# Patient Record
Sex: Female | Born: 1965
Health system: Southern US, Community
[De-identification: ages and names within clinical notes are randomized; demographics above are authoritative.]

## PROBLEM LIST (undated history)

## (undated) DIAGNOSIS — D150 Benign neoplasm of thymus: Secondary | ICD-10-CM

## (undated) DIAGNOSIS — D649 Anemia, unspecified: Secondary | ICD-10-CM

## (undated) DIAGNOSIS — K219 Gastro-esophageal reflux disease without esophagitis: Secondary | ICD-10-CM

## (undated) DIAGNOSIS — R112 Nausea with vomiting, unspecified: Secondary | ICD-10-CM

## (undated) DIAGNOSIS — Z9889 Other specified postprocedural states: Secondary | ICD-10-CM

## (undated) DIAGNOSIS — Z9884 Bariatric surgery status: Secondary | ICD-10-CM

## (undated) DIAGNOSIS — I10 Essential (primary) hypertension: Secondary | ICD-10-CM

## (undated) HISTORY — PX: NECK SURGERY: SHX720

## (undated) HISTORY — DX: Bariatric surgery status: Z98.84

## (undated) HISTORY — PX: ANKLE SURGERY: SHX546

## (undated) HISTORY — PX: BREAST SURGERY: SHX581

## (undated) HISTORY — DX: Benign neoplasm of thymus: D15.0

---

## 1997-07-28 ENCOUNTER — Ambulatory Visit (HOSPITAL_COMMUNITY): Admission: RE | Admit: 1997-07-28 | Discharge: 1997-07-28 | Payer: Self-pay | Admitting: Internal Medicine

## 1997-11-28 ENCOUNTER — Emergency Department (HOSPITAL_COMMUNITY): Admission: EM | Admit: 1997-11-28 | Discharge: 1997-11-28 | Payer: Self-pay | Admitting: Emergency Medicine

## 1998-04-14 ENCOUNTER — Other Ambulatory Visit: Admission: RE | Admit: 1998-04-14 | Discharge: 1998-04-14 | Payer: Self-pay | Admitting: Obstetrics and Gynecology

## 1999-07-08 ENCOUNTER — Encounter: Admission: RE | Admit: 1999-07-08 | Discharge: 1999-07-08 | Payer: Self-pay | Admitting: Neurosurgery

## 1999-07-08 ENCOUNTER — Encounter: Payer: Self-pay | Admitting: Neurosurgery

## 1999-07-25 ENCOUNTER — Encounter
Admission: RE | Admit: 1999-07-25 | Discharge: 1999-08-22 | Payer: Self-pay | Admitting: Physical Medicine & Rehabilitation

## 1999-10-19 ENCOUNTER — Emergency Department (HOSPITAL_COMMUNITY): Admission: EM | Admit: 1999-10-19 | Discharge: 1999-10-19 | Payer: Self-pay | Admitting: Emergency Medicine

## 1999-10-19 ENCOUNTER — Encounter: Payer: Self-pay | Admitting: Emergency Medicine

## 2000-01-27 ENCOUNTER — Other Ambulatory Visit: Admission: RE | Admit: 2000-01-27 | Discharge: 2000-01-27 | Payer: Self-pay | Admitting: Obstetrics and Gynecology

## 2000-04-20 ENCOUNTER — Emergency Department (HOSPITAL_COMMUNITY): Admission: EM | Admit: 2000-04-20 | Discharge: 2000-04-20 | Payer: Self-pay | Admitting: Emergency Medicine

## 2000-04-27 ENCOUNTER — Encounter: Payer: Self-pay | Admitting: Obstetrics and Gynecology

## 2000-04-27 ENCOUNTER — Encounter (INDEPENDENT_AMBULATORY_CARE_PROVIDER_SITE_OTHER): Payer: Self-pay

## 2000-04-27 ENCOUNTER — Inpatient Hospital Stay (HOSPITAL_COMMUNITY): Admission: AD | Admit: 2000-04-27 | Discharge: 2000-05-11 | Payer: Self-pay | Admitting: Obstetrics and Gynecology

## 2000-04-30 ENCOUNTER — Encounter: Payer: Self-pay | Admitting: *Deleted

## 2000-05-02 ENCOUNTER — Encounter: Payer: Self-pay | Admitting: *Deleted

## 2000-05-03 ENCOUNTER — Encounter: Payer: Self-pay | Admitting: Obstetrics and Gynecology

## 2000-05-12 ENCOUNTER — Encounter: Admission: RE | Admit: 2000-05-12 | Discharge: 2000-07-11 | Payer: Self-pay | Admitting: Obstetrics and Gynecology

## 2000-06-07 ENCOUNTER — Other Ambulatory Visit: Admission: RE | Admit: 2000-06-07 | Discharge: 2000-06-07 | Payer: Self-pay | Admitting: Obstetrics and Gynecology

## 2000-07-12 ENCOUNTER — Encounter: Admission: RE | Admit: 2000-07-12 | Discharge: 2000-08-11 | Payer: Self-pay | Admitting: Obstetrics and Gynecology

## 2000-09-11 ENCOUNTER — Encounter: Admission: RE | Admit: 2000-09-11 | Discharge: 2000-10-11 | Payer: Self-pay | Admitting: Obstetrics and Gynecology

## 2000-10-12 ENCOUNTER — Encounter: Admission: RE | Admit: 2000-10-12 | Discharge: 2000-11-11 | Payer: Self-pay | Admitting: Obstetrics and Gynecology

## 2000-11-14 ENCOUNTER — Ambulatory Visit (HOSPITAL_COMMUNITY): Admission: RE | Admit: 2000-11-14 | Discharge: 2000-11-14 | Payer: Self-pay | Admitting: Internal Medicine

## 2000-12-24 ENCOUNTER — Encounter: Payer: Self-pay | Admitting: Internal Medicine

## 2000-12-24 ENCOUNTER — Encounter: Admission: RE | Admit: 2000-12-24 | Discharge: 2000-12-24 | Payer: Self-pay | Admitting: Internal Medicine

## 2001-01-23 HISTORY — PX: GASTRIC BYPASS: SHX52

## 2001-05-28 ENCOUNTER — Encounter (INDEPENDENT_AMBULATORY_CARE_PROVIDER_SITE_OTHER): Payer: Self-pay | Admitting: Specialist

## 2001-05-28 ENCOUNTER — Ambulatory Visit (HOSPITAL_COMMUNITY): Admission: RE | Admit: 2001-05-28 | Discharge: 2001-05-28 | Payer: Self-pay | Admitting: Obstetrics and Gynecology

## 2001-11-13 ENCOUNTER — Other Ambulatory Visit: Admission: RE | Admit: 2001-11-13 | Discharge: 2001-11-13 | Payer: Self-pay | Admitting: Obstetrics and Gynecology

## 2003-04-01 ENCOUNTER — Other Ambulatory Visit: Admission: RE | Admit: 2003-04-01 | Discharge: 2003-04-01 | Payer: Self-pay | Admitting: Obstetrics and Gynecology

## 2003-07-13 ENCOUNTER — Ambulatory Visit (HOSPITAL_COMMUNITY): Admission: RE | Admit: 2003-07-13 | Discharge: 2003-07-13 | Payer: Self-pay | Admitting: Specialist

## 2003-07-13 ENCOUNTER — Ambulatory Visit (HOSPITAL_BASED_OUTPATIENT_CLINIC_OR_DEPARTMENT_OTHER): Admission: RE | Admit: 2003-07-13 | Discharge: 2003-07-13 | Payer: Self-pay | Admitting: Specialist

## 2003-07-13 ENCOUNTER — Encounter (INDEPENDENT_AMBULATORY_CARE_PROVIDER_SITE_OTHER): Payer: Self-pay | Admitting: *Deleted

## 2004-04-18 ENCOUNTER — Encounter (INDEPENDENT_AMBULATORY_CARE_PROVIDER_SITE_OTHER): Payer: Self-pay | Admitting: Specialist

## 2004-04-18 ENCOUNTER — Ambulatory Visit (HOSPITAL_BASED_OUTPATIENT_CLINIC_OR_DEPARTMENT_OTHER): Admission: RE | Admit: 2004-04-18 | Discharge: 2004-04-18 | Payer: Self-pay | Admitting: Specialist

## 2004-04-18 ENCOUNTER — Ambulatory Visit (HOSPITAL_COMMUNITY): Admission: RE | Admit: 2004-04-18 | Discharge: 2004-04-18 | Payer: Self-pay | Admitting: Specialist

## 2004-07-05 ENCOUNTER — Other Ambulatory Visit: Admission: RE | Admit: 2004-07-05 | Discharge: 2004-07-05 | Payer: Self-pay | Admitting: Obstetrics and Gynecology

## 2004-08-30 ENCOUNTER — Encounter: Admission: RE | Admit: 2004-08-30 | Discharge: 2004-08-30 | Payer: Self-pay | Admitting: Internal Medicine

## 2004-09-15 ENCOUNTER — Ambulatory Visit (HOSPITAL_COMMUNITY): Payer: Self-pay | Admitting: Psychiatry

## 2004-10-13 ENCOUNTER — Ambulatory Visit (HOSPITAL_COMMUNITY): Payer: Self-pay | Admitting: Psychiatry

## 2004-10-26 ENCOUNTER — Encounter: Admission: RE | Admit: 2004-10-26 | Discharge: 2004-10-26 | Payer: Self-pay | Admitting: Neurosurgery

## 2004-11-22 ENCOUNTER — Encounter: Admission: RE | Admit: 2004-11-22 | Discharge: 2004-11-22 | Payer: Self-pay | Admitting: Family Medicine

## 2004-12-19 ENCOUNTER — Encounter: Admission: RE | Admit: 2004-12-19 | Discharge: 2004-12-19 | Payer: Self-pay | Admitting: Family Medicine

## 2005-07-10 ENCOUNTER — Ambulatory Visit: Payer: Self-pay | Admitting: Physical Medicine & Rehabilitation

## 2005-07-10 ENCOUNTER — Encounter
Admission: RE | Admit: 2005-07-10 | Discharge: 2005-10-08 | Payer: Self-pay | Admitting: Physical Medicine & Rehabilitation

## 2005-07-28 ENCOUNTER — Ambulatory Visit: Payer: Self-pay | Admitting: Anesthesiology

## 2005-07-28 ENCOUNTER — Encounter: Admission: RE | Admit: 2005-07-28 | Discharge: 2005-10-26 | Payer: Self-pay | Admitting: Anesthesiology

## 2005-09-14 ENCOUNTER — Encounter: Admission: RE | Admit: 2005-09-14 | Discharge: 2005-09-14 | Payer: Self-pay | Admitting: Neurosurgery

## 2005-09-26 ENCOUNTER — Ambulatory Visit: Payer: Self-pay | Admitting: Anesthesiology

## 2006-01-23 DIAGNOSIS — D649 Anemia, unspecified: Secondary | ICD-10-CM

## 2006-01-23 HISTORY — DX: Anemia, unspecified: D64.9

## 2006-03-14 ENCOUNTER — Ambulatory Visit (HOSPITAL_COMMUNITY): Admission: RE | Admit: 2006-03-14 | Discharge: 2006-03-14 | Payer: Self-pay | Admitting: Obstetrics and Gynecology

## 2006-05-23 ENCOUNTER — Ambulatory Visit: Payer: Self-pay | Admitting: Oncology

## 2006-05-23 ENCOUNTER — Encounter (HOSPITAL_COMMUNITY): Admission: RE | Admit: 2006-05-23 | Discharge: 2006-08-21 | Payer: Self-pay | Admitting: *Deleted

## 2006-07-13 ENCOUNTER — Ambulatory Visit: Payer: Self-pay | Admitting: Oncology

## 2006-07-18 LAB — CBC & DIFF AND RETIC
Basophils Absolute: 0 10*3/uL (ref 0.0–0.1)
Eosinophils Absolute: 0.1 10*3/uL (ref 0.0–0.5)
HGB: 9.4 g/dL — ABNORMAL LOW (ref 11.6–15.9)
IRF: 0.3 (ref 0.130–0.330)
MONO#: 0.5 10*3/uL (ref 0.1–0.9)
NEUT#: 5.4 10*3/uL (ref 1.5–6.5)
RBC: 4.33 10*6/uL (ref 3.70–5.32)
RDW: 20.4 % — ABNORMAL HIGH (ref 11.3–14.5)
RETIC #: 26 10*3/uL (ref 19.7–115.1)
Retic %: 0.6 % (ref 0.4–2.3)
WBC: 7.9 10*3/uL (ref 3.9–10.0)

## 2006-07-18 LAB — COMPREHENSIVE METABOLIC PANEL
ALT: 19 U/L (ref 0–35)
AST: 25 U/L (ref 0–37)
Albumin: 3.6 g/dL (ref 3.5–5.2)
Alkaline Phosphatase: 105 U/L (ref 39–117)
BUN: 10 mg/dL (ref 6–23)
Chloride: 108 mEq/L (ref 96–112)
Potassium: 4.1 mEq/L (ref 3.5–5.3)
Sodium: 139 mEq/L (ref 135–145)
Total Protein: 6.1 g/dL (ref 6.0–8.3)

## 2006-07-18 LAB — MORPHOLOGY: PLT EST: ADEQUATE

## 2006-07-18 LAB — IRON AND TIBC: UIBC: 284 ug/dL

## 2006-07-18 LAB — FERRITIN: Ferritin: 6 ng/mL — ABNORMAL LOW (ref 10–291)

## 2006-08-30 ENCOUNTER — Ambulatory Visit: Payer: Self-pay | Admitting: Oncology

## 2006-09-03 LAB — CBC WITH DIFFERENTIAL/PLATELET
Basophils Absolute: 0 10*3/uL (ref 0.0–0.1)
EOS%: 2.5 % (ref 0.0–7.0)
Eosinophils Absolute: 0.1 10*3/uL (ref 0.0–0.5)
HGB: 8.8 g/dL — ABNORMAL LOW (ref 11.6–15.9)
LYMPH%: 29 % (ref 14.0–48.0)
MCH: 22.6 pg — ABNORMAL LOW (ref 26.0–34.0)
MCV: 69.4 fL — ABNORMAL LOW (ref 81.0–101.0)
MONO%: 8.3 % (ref 0.0–13.0)
NEUT#: 2.8 10*3/uL (ref 1.5–6.5)
Platelets: 229 10*3/uL (ref 145–400)
RBC: 3.9 10*6/uL (ref 3.70–5.32)

## 2006-09-03 LAB — FERRITIN: Ferritin: 7 ng/mL — ABNORMAL LOW (ref 10–291)

## 2006-09-16 ENCOUNTER — Emergency Department (HOSPITAL_COMMUNITY): Admission: EM | Admit: 2006-09-16 | Discharge: 2006-09-16 | Payer: Self-pay | Admitting: Emergency Medicine

## 2006-10-05 LAB — CBC WITH DIFFERENTIAL/PLATELET
Basophils Absolute: 0 10*3/uL (ref 0.0–0.1)
Eosinophils Absolute: 0.1 10*3/uL (ref 0.0–0.5)
HCT: 33.3 % — ABNORMAL LOW (ref 34.8–46.6)
HGB: 11.2 g/dL — ABNORMAL LOW (ref 11.6–15.9)
MONO#: 0.4 10*3/uL (ref 0.1–0.9)
NEUT%: 63.3 % (ref 39.6–76.8)
Platelets: 245 10*3/uL (ref 145–400)
WBC: 5.3 10*3/uL (ref 3.9–10.0)
lymph#: 1.5 10*3/uL (ref 0.9–3.3)

## 2006-10-17 ENCOUNTER — Ambulatory Visit (HOSPITAL_COMMUNITY): Admission: RE | Admit: 2006-10-17 | Discharge: 2006-10-18 | Payer: Self-pay | Admitting: Orthopedic Surgery

## 2006-11-07 ENCOUNTER — Ambulatory Visit: Payer: Self-pay | Admitting: Oncology

## 2006-11-30 LAB — CBC WITH DIFFERENTIAL/PLATELET
BASO%: 0.6 % (ref 0.0–2.0)
EOS%: 2.4 % (ref 0.0–7.0)
HCT: 34.7 % — ABNORMAL LOW (ref 34.8–46.6)
MCH: 28 pg (ref 26.0–34.0)
MCHC: 34.8 g/dL (ref 32.0–36.0)
MONO%: 5.7 % (ref 0.0–13.0)
NEUT%: 64.7 % (ref 39.6–76.8)
RDW: 19.7 % — ABNORMAL HIGH (ref 11.3–14.5)
lymph#: 1.3 10*3/uL (ref 0.9–3.3)

## 2006-11-30 LAB — IRON AND TIBC: UIBC: 170 ug/dL

## 2007-02-27 ENCOUNTER — Ambulatory Visit: Payer: Self-pay | Admitting: Oncology

## 2007-03-08 LAB — CBC & DIFF AND RETIC
Basophils Absolute: 0 10*3/uL (ref 0.0–0.1)
Eosinophils Absolute: 0.2 10*3/uL (ref 0.0–0.5)
HCT: 40.5 % (ref 34.8–46.6)
HGB: 12.9 g/dL (ref 11.6–15.9)
IRF: 0.18 (ref 0.130–0.330)
MCH: 27.4 pg (ref 26.0–34.0)
MONO#: 0.5 10*3/uL (ref 0.1–0.9)
NEUT#: 5 10*3/uL (ref 1.5–6.5)
NEUT%: 69 % (ref 39.6–76.8)
RDW: 13.3 % (ref 11.3–14.5)
RETIC #: 31.2 10*3/uL (ref 19.7–115.1)
WBC: 7.3 10*3/uL (ref 3.9–10.0)
lymph#: 1.5 10*3/uL (ref 0.9–3.3)

## 2007-03-08 LAB — COMPREHENSIVE METABOLIC PANEL
ALT: 51 U/L — ABNORMAL HIGH (ref 0–35)
AST: 56 U/L — ABNORMAL HIGH (ref 0–37)
CO2: 25 mEq/L (ref 19–32)
Calcium: 8.2 mg/dL — ABNORMAL LOW (ref 8.4–10.5)
Chloride: 108 mEq/L (ref 96–112)
Sodium: 142 mEq/L (ref 135–145)
Total Protein: 6 g/dL (ref 6.0–8.3)

## 2007-03-08 LAB — IRON AND TIBC
%SAT: 23 % (ref 20–55)
Iron: 56 ug/dL (ref 42–145)

## 2007-03-08 LAB — FERRITIN: Ferritin: 149 ng/mL (ref 10–291)

## 2007-05-29 ENCOUNTER — Ambulatory Visit: Payer: Self-pay | Admitting: Oncology

## 2007-08-27 ENCOUNTER — Ambulatory Visit: Payer: Self-pay | Admitting: Oncology

## 2007-09-18 LAB — CBC WITH DIFFERENTIAL/PLATELET
Eosinophils Absolute: 0.1 10*3/uL (ref 0.0–0.5)
HCT: 33.6 % — ABNORMAL LOW (ref 34.8–46.6)
LYMPH%: 24.2 % (ref 14.0–48.0)
MCV: 86.3 fL (ref 81.0–101.0)
MONO%: 7.1 % (ref 0.0–13.0)
NEUT#: 2.9 10*3/uL (ref 1.5–6.5)
NEUT%: 67.1 % (ref 39.6–76.8)
Platelets: 172 10*3/uL (ref 145–400)
RBC: 3.9 10*6/uL (ref 3.70–5.32)

## 2007-09-18 LAB — IRON AND TIBC: Iron: 62 ug/dL (ref 42–145)

## 2007-09-18 LAB — FERRITIN: Ferritin: 151 ng/mL (ref 10–291)

## 2007-10-17 ENCOUNTER — Encounter: Admission: RE | Admit: 2007-10-17 | Discharge: 2007-10-17 | Payer: Self-pay | Admitting: Family Medicine

## 2007-10-17 ENCOUNTER — Emergency Department (HOSPITAL_COMMUNITY): Admission: EM | Admit: 2007-10-17 | Discharge: 2007-10-17 | Payer: Self-pay | Admitting: Emergency Medicine

## 2007-10-25 ENCOUNTER — Encounter: Payer: Self-pay | Admitting: Family Medicine

## 2007-10-25 ENCOUNTER — Ambulatory Visit: Payer: Self-pay | Admitting: Vascular Surgery

## 2007-10-25 ENCOUNTER — Ambulatory Visit (HOSPITAL_COMMUNITY): Admission: RE | Admit: 2007-10-25 | Discharge: 2007-10-25 | Payer: Self-pay | Admitting: Family Medicine

## 2007-11-07 ENCOUNTER — Ambulatory Visit: Payer: Self-pay | Admitting: Thoracic Surgery (Cardiothoracic Vascular Surgery)

## 2007-11-13 ENCOUNTER — Ambulatory Visit (HOSPITAL_COMMUNITY)
Admission: RE | Admit: 2007-11-13 | Discharge: 2007-11-13 | Payer: Self-pay | Admitting: Thoracic Surgery (Cardiothoracic Vascular Surgery)

## 2007-11-13 ENCOUNTER — Ambulatory Visit: Payer: Self-pay | Admitting: Thoracic Surgery (Cardiothoracic Vascular Surgery)

## 2007-11-25 ENCOUNTER — Encounter: Payer: Self-pay | Admitting: Thoracic Surgery (Cardiothoracic Vascular Surgery)

## 2007-11-25 ENCOUNTER — Ambulatory Visit: Payer: Self-pay | Admitting: Thoracic Surgery (Cardiothoracic Vascular Surgery)

## 2007-11-25 ENCOUNTER — Inpatient Hospital Stay (HOSPITAL_COMMUNITY)
Admission: RE | Admit: 2007-11-25 | Discharge: 2007-11-28 | Payer: Self-pay | Admitting: Thoracic Surgery (Cardiothoracic Vascular Surgery)

## 2007-11-27 ENCOUNTER — Ambulatory Visit: Payer: Self-pay | Admitting: Oncology

## 2007-12-03 ENCOUNTER — Ambulatory Visit: Payer: Self-pay | Admitting: Thoracic Surgery (Cardiothoracic Vascular Surgery)

## 2007-12-13 ENCOUNTER — Encounter
Admission: RE | Admit: 2007-12-13 | Discharge: 2007-12-13 | Payer: Self-pay | Admitting: Thoracic Surgery (Cardiothoracic Vascular Surgery)

## 2007-12-13 ENCOUNTER — Ambulatory Visit: Payer: Self-pay | Admitting: Thoracic Surgery (Cardiothoracic Vascular Surgery)

## 2008-01-29 ENCOUNTER — Ambulatory Visit: Payer: Self-pay | Admitting: Oncology

## 2008-01-31 LAB — CBC WITH DIFFERENTIAL/PLATELET
BASO%: 0.5 % (ref 0.0–2.0)
EOS%: 4 % (ref 0.0–7.0)
LYMPH%: 36.1 % (ref 14.0–48.0)
MCHC: 33.5 g/dL (ref 32.0–36.0)
MONO#: 0.3 10*3/uL (ref 0.1–0.9)
RBC: 4.26 10*6/uL (ref 3.70–5.32)
WBC: 5.1 10*3/uL (ref 3.9–10.0)
lymph#: 1.8 10*3/uL (ref 0.9–3.3)

## 2008-01-31 LAB — FERRITIN: Ferritin: 42 ng/mL (ref 10–291)

## 2008-01-31 LAB — IRON AND TIBC
TIBC: 282 ug/dL (ref 250–470)
UIBC: 239 ug/dL

## 2008-05-27 ENCOUNTER — Ambulatory Visit: Payer: Self-pay | Admitting: Oncology

## 2008-05-29 LAB — CBC WITH DIFFERENTIAL/PLATELET
BASO%: 0.3 % (ref 0.0–2.0)
EOS%: 3.1 % (ref 0.0–7.0)
MCH: 28.7 pg (ref 25.1–34.0)
MCHC: 33.8 g/dL (ref 31.5–36.0)
RBC: 4.11 10*6/uL (ref 3.70–5.45)
RDW: 14.1 % (ref 11.2–14.5)
lymph#: 1.8 10*3/uL (ref 0.9–3.3)

## 2008-05-29 LAB — VITAMIN B12: Vitamin B-12: 256 pg/mL (ref 211–911)

## 2008-09-13 IMAGING — RF DG HYSTEROGRAM
5 series · 5 of 5 positions shown · non-contrast
Comparison: none

CLINICAL DATA: Status post ESSURE implants for contraception.  Evaluate tubal patency.
HYSTEROSALPINGOGRAM:

[Series 1: run · 1 of 1 slices shown (1 of 5)]
[im 1/1]
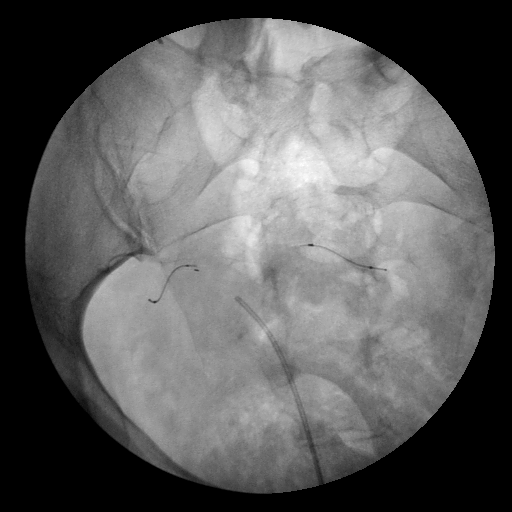

[Series 2: run · 1 of 1 slices shown (2 of 5)]
[im 1/1]
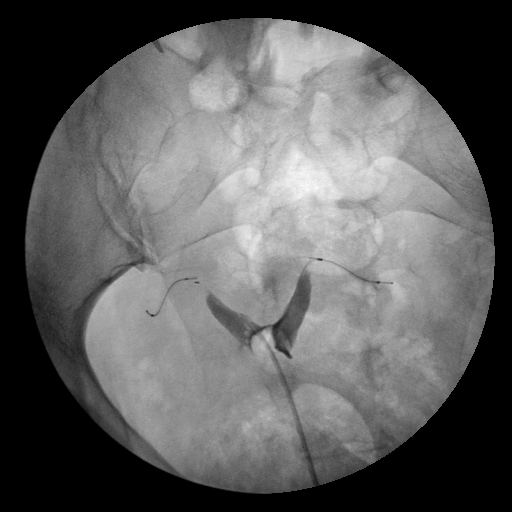

[Series 3: run · 1 of 1 slices shown (3 of 5)]
[im 1/1]
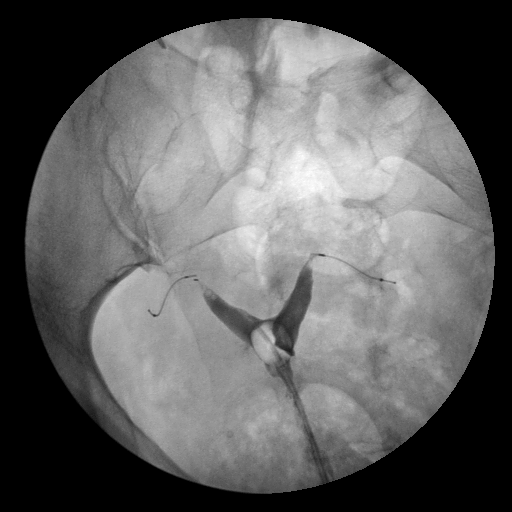

[Series 4: run · 1 of 1 slices shown (4 of 5)]
[im 1/1]
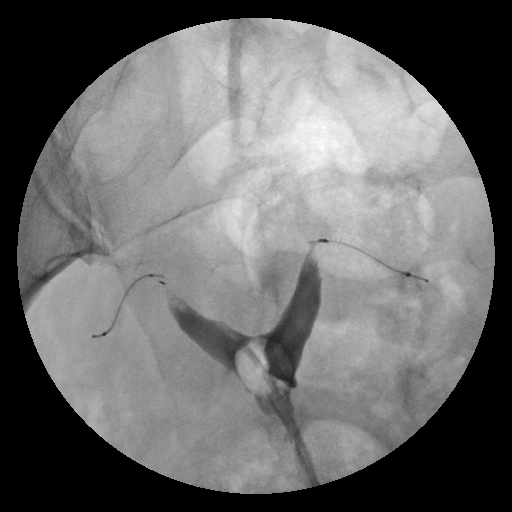

[Series 5: run · 1 of 1 slices shown (5 of 5)]
[im 1/1]
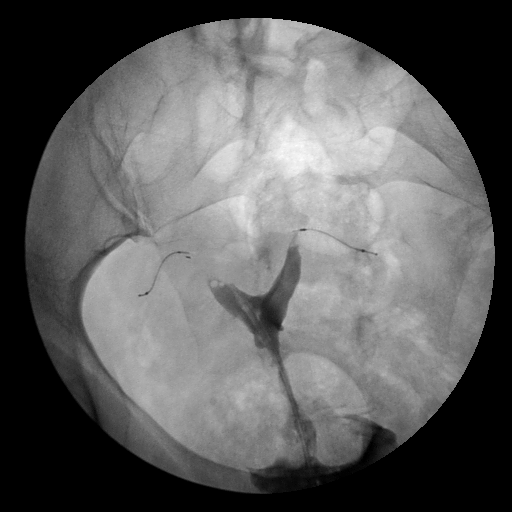

[5 of 5 positions shown; findings below may reference images not displayed]

FINDINGS: Hysterosalpingogram was performed by Dr. Dothi.  Five fluoroscopic spot images are submitted for interpretation.
Both Essure implants are seen in satisfactory position within the fallopian tubes.  The right fallopian tube is occluded at the cornua.  Minimal contrast is seen in the proximal most portion of the left fallopian tube along the proximal ends of the inner and outer coils.  However, there is no contrast in the tube along the length of the outer coil.  
A deep indentation is seen in the fundal portion of the endometrial cavity dividing the majority of the endometrial cavity into two portions, without significant splaying of the intercornual distance.  This is most consistent with a subseptate uterus.
IMPRESSION: 1. Satisfactory location of both Essure implants. 
2. Bilateral tubal occlusion, grade 1 on the right and grade 2 on the left.  
3. Probable subseptate uterus.

## 2008-09-30 ENCOUNTER — Ambulatory Visit: Payer: Self-pay | Admitting: Oncology

## 2008-10-12 LAB — CBC WITH DIFFERENTIAL/PLATELET
Basophils Absolute: 0 10*3/uL (ref 0.0–0.1)
Eosinophils Absolute: 0.1 10*3/uL (ref 0.0–0.5)
HGB: 10.7 g/dL — ABNORMAL LOW (ref 11.6–15.9)
MCV: 84.9 fL (ref 79.5–101.0)
MONO%: 6.8 % (ref 0.0–14.0)
NEUT#: 2.1 10*3/uL (ref 1.5–6.5)
RDW: 13.4 % (ref 11.2–14.5)

## 2008-10-12 LAB — FERRITIN: Ferritin: 18 ng/mL (ref 10–291)

## 2008-10-12 LAB — IRON AND TIBC
%SAT: 26 % (ref 20–55)
Iron: 71 ug/dL (ref 42–145)
UIBC: 202 ug/dL

## 2008-10-30 ENCOUNTER — Ambulatory Visit: Payer: Self-pay | Admitting: Oncology

## 2008-12-15 ENCOUNTER — Ambulatory Visit: Payer: Self-pay | Admitting: Thoracic Surgery (Cardiothoracic Vascular Surgery)

## 2008-12-15 ENCOUNTER — Encounter
Admission: RE | Admit: 2008-12-15 | Discharge: 2008-12-15 | Payer: Self-pay | Admitting: Thoracic Surgery (Cardiothoracic Vascular Surgery)

## 2009-01-29 ENCOUNTER — Ambulatory Visit: Payer: Self-pay | Admitting: Oncology

## 2009-01-29 LAB — CBC & DIFF AND RETIC
BASO%: 0.3 % (ref 0.0–2.0)
Basophils Absolute: 0 10*3/uL (ref 0.0–0.1)
EOS%: 1.9 % (ref 0.0–7.0)
Eosinophils Absolute: 0.1 10*3/uL (ref 0.0–0.5)
HCT: 36.4 % (ref 34.8–46.6)
HGB: 11.7 g/dL (ref 11.6–15.9)
Immature Retic Fract: 2.8 % (ref 0.00–10.70)
LYMPH%: 35 % (ref 14.0–49.7)
MCH: 29 pg (ref 25.1–34.0)
MCHC: 32.1 g/dL (ref 31.5–36.0)
MCV: 90.3 fL (ref 79.5–101.0)
MONO#: 0.3 10*3/uL (ref 0.1–0.9)
MONO%: 7.5 % (ref 0.0–14.0)
NEUT#: 2.1 10*3/uL (ref 1.5–6.5)
NEUT%: 55.3 % (ref 38.4–76.8)
Platelets: 165 10*3/uL (ref 145–400)
RBC: 4.03 10*6/uL (ref 3.70–5.45)
RDW: 13.6 % (ref 11.2–14.5)
Retic %: 0.76 % (ref 0.50–1.50)
Retic Ct Abs: 30.63 10*3/uL (ref 18.30–72.70)
WBC: 3.7 10*3/uL — ABNORMAL LOW (ref 3.9–10.3)
lymph#: 1.3 10*3/uL (ref 0.9–3.3)

## 2009-01-29 LAB — IRON AND TIBC
%SAT: 31 % (ref 20–55)
Iron: 63 ug/dL (ref 42–145)
TIBC: 203 ug/dL — ABNORMAL LOW (ref 250–470)
UIBC: 140 ug/dL

## 2009-01-29 LAB — FERRITIN: Ferritin: 278 ng/mL (ref 10–291)

## 2009-01-29 LAB — VITAMIN B12: Vitamin B-12: 259 pg/mL (ref 211–911)

## 2009-03-01 ENCOUNTER — Ambulatory Visit: Payer: Self-pay | Admitting: Oncology

## 2009-03-01 LAB — CBC & DIFF AND RETIC
Basophils Absolute: 0 10*3/uL (ref 0.0–0.1)
Eosinophils Absolute: 0.1 10*3/uL (ref 0.0–0.5)
HGB: 11.9 g/dL (ref 11.6–15.9)
Immature Retic Fract: 1.9 % (ref 0.00–10.70)
MONO#: 0.3 10*3/uL (ref 0.1–0.9)
NEUT#: 2.4 10*3/uL (ref 1.5–6.5)
RBC: 4.04 10*6/uL (ref 3.70–5.45)
RDW: 13.4 % (ref 11.2–14.5)
Retic %: 0.99 % (ref 0.50–1.50)
Retic Ct Abs: 40 10*3/uL (ref 18.30–72.70)
WBC: 4.5 10*3/uL (ref 3.9–10.3)
lymph#: 1.6 10*3/uL (ref 0.9–3.3)

## 2009-03-01 LAB — MORPHOLOGY: PLT EST: ADEQUATE

## 2009-06-16 ENCOUNTER — Encounter
Admission: RE | Admit: 2009-06-16 | Discharge: 2009-06-16 | Payer: Self-pay | Admitting: Thoracic Surgery (Cardiothoracic Vascular Surgery)

## 2009-06-16 ENCOUNTER — Ambulatory Visit: Payer: Self-pay | Admitting: Thoracic Surgery (Cardiothoracic Vascular Surgery)

## 2009-06-25 ENCOUNTER — Ambulatory Visit: Payer: Self-pay | Admitting: Oncology

## 2009-10-28 ENCOUNTER — Ambulatory Visit: Payer: Self-pay | Admitting: Oncology

## 2009-11-01 LAB — CBC WITH DIFFERENTIAL/PLATELET
BASO%: 0.3 % (ref 0.0–2.0)
EOS%: 2.2 % (ref 0.0–7.0)
HCT: 35.1 % (ref 34.8–46.6)
LYMPH%: 27.9 % (ref 14.0–49.7)
MCH: 29.4 pg (ref 25.1–34.0)
MCHC: 33 g/dL (ref 31.5–36.0)
MONO#: 0.3 10*3/uL (ref 0.1–0.9)
NEUT%: 64.7 % (ref 38.4–76.8)
RBC: 3.94 10*6/uL (ref 3.70–5.45)
WBC: 5.9 10*3/uL (ref 3.9–10.3)
lymph#: 1.6 10*3/uL (ref 0.9–3.3)
nRBC: 0 % (ref 0–0)

## 2009-11-01 LAB — FOLATE: Folate: 7.4 ng/mL

## 2010-04-18 IMAGING — CR DG CHEST 2V
2 series · 2 of 2 positions shown · non-contrast
Comparison: None

CLINICAL DATA: Left-sided chest pain radiating to the left shoulder
upon inspiration.  Nonsmoker.

CHEST - 2 VIEW

[view not recorded (1 of 2)]
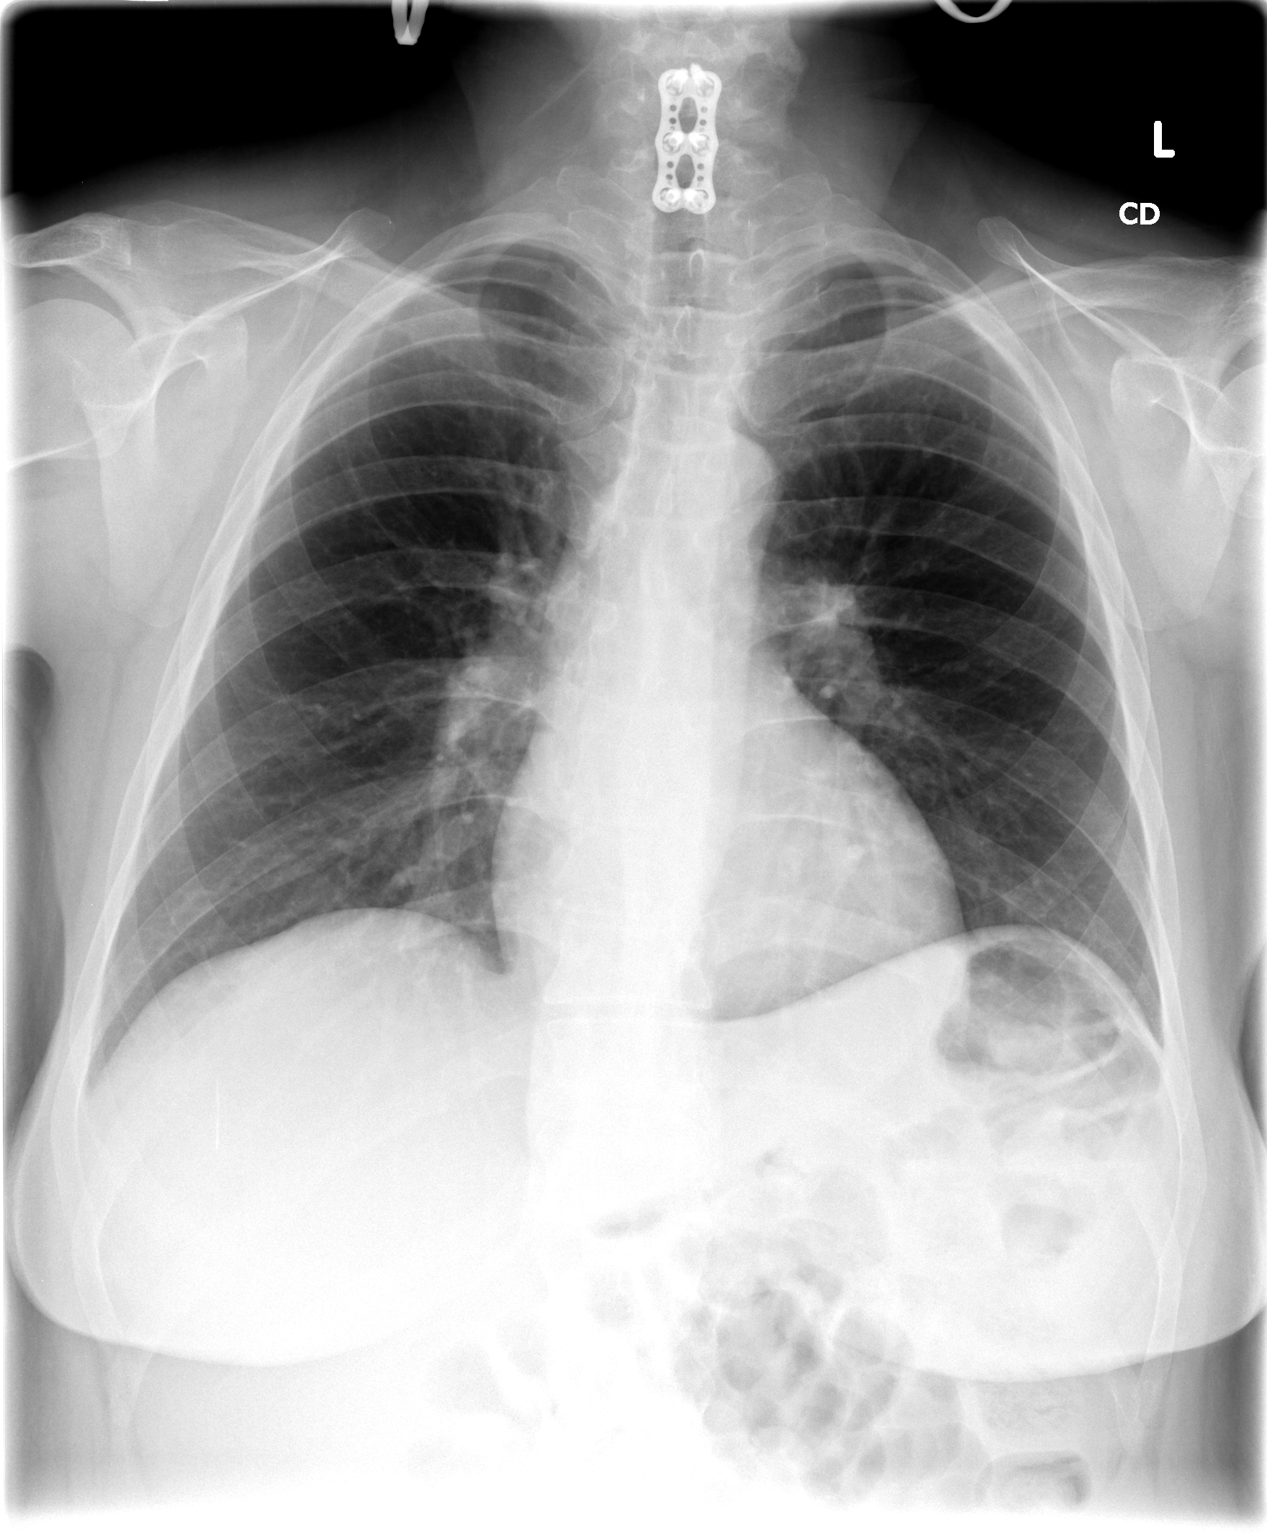

[view not recorded (2 of 2)]
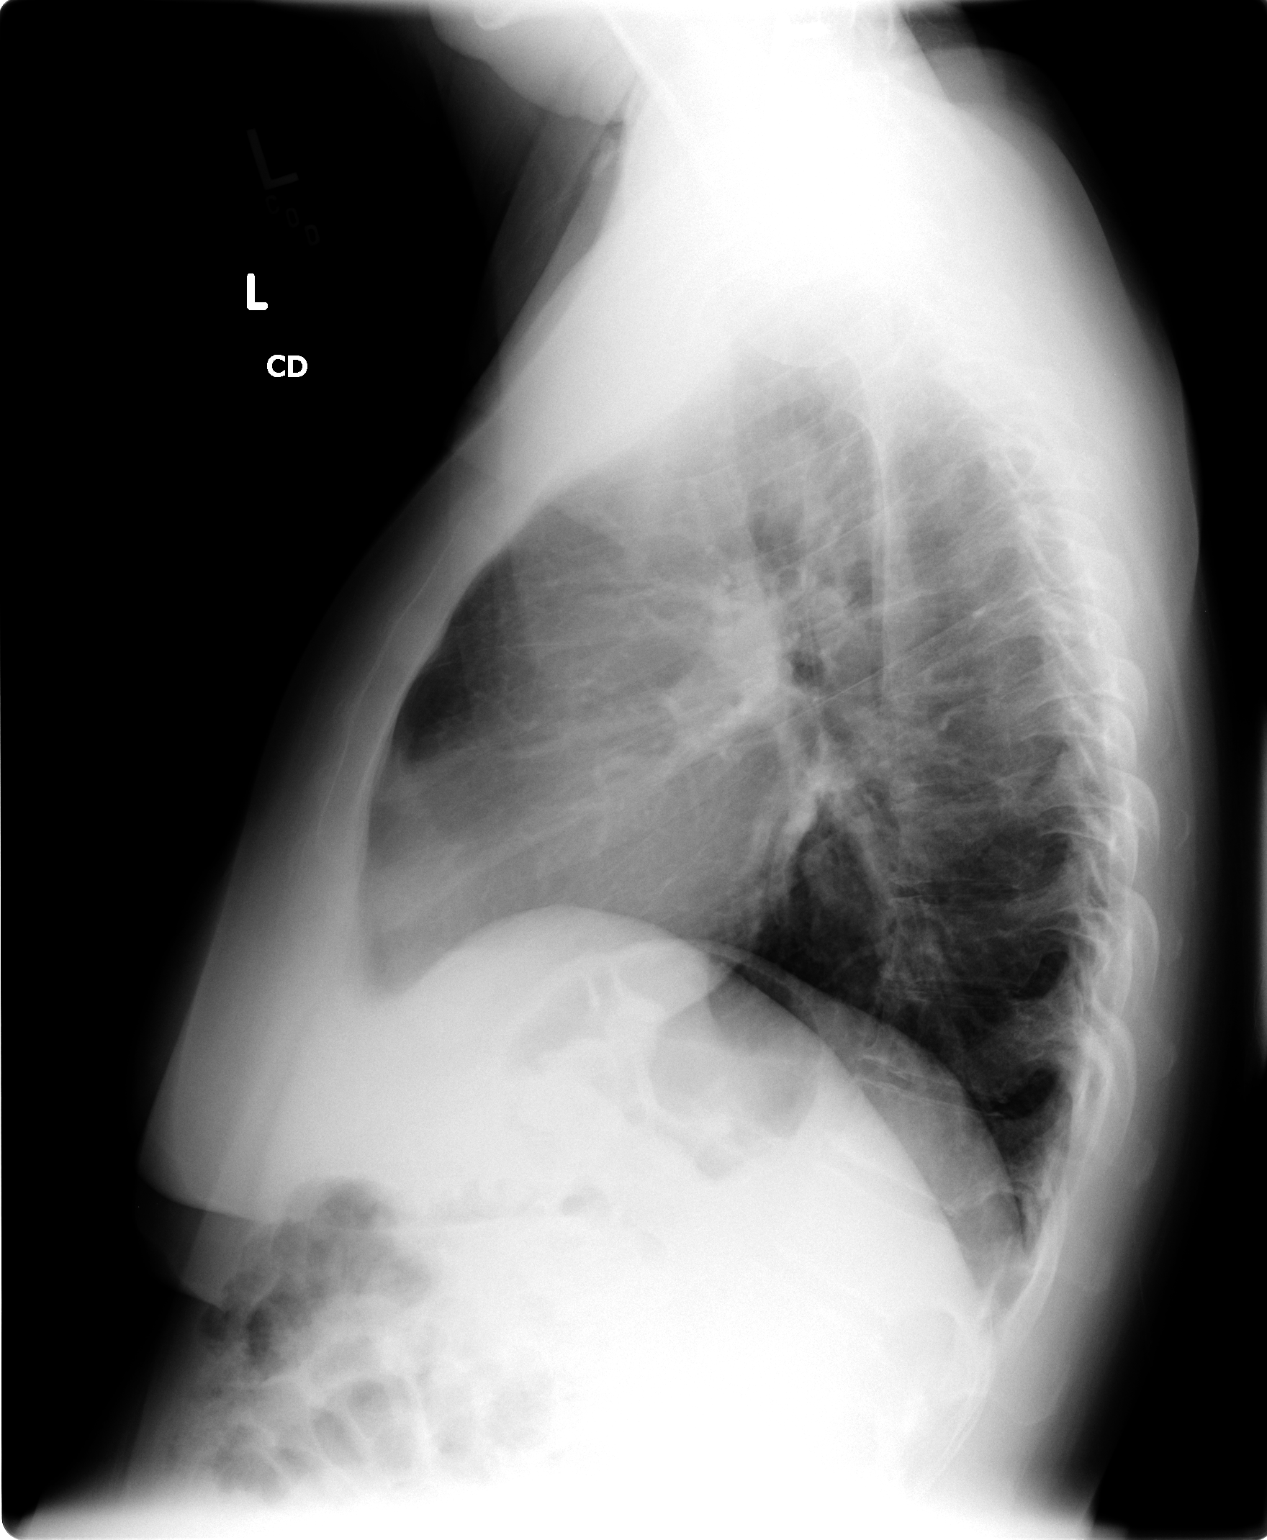

[2 of 2 positions shown; findings below may reference images not displayed]

FINDINGS: Lungs appear clear with normal heart size.  Minimal
dextrorotary scoliosis thoracolumbar spine junction is seen.
Anterior inferior cervical spine fusion hardware visualized.
Mediastinum, hila, pleura and osseous structures appear otherwise
normal.
IMPRESSION: 1.  Slight scoliosis.
2.  No active cardiopulmonary disease.

## 2010-04-29 ENCOUNTER — Other Ambulatory Visit: Payer: Self-pay | Admitting: Thoracic Surgery (Cardiothoracic Vascular Surgery)

## 2010-04-29 DIAGNOSIS — J9859 Other diseases of mediastinum, not elsewhere classified: Secondary | ICD-10-CM

## 2010-06-03 ENCOUNTER — Emergency Department (HOSPITAL_COMMUNITY)
Admission: EM | Admit: 2010-06-03 | Discharge: 2010-06-03 | Disposition: A | Discharge status: Home / Self Care | Payer: BC Managed Care – PPO | Attending: Emergency Medicine | Admitting: Emergency Medicine

## 2010-06-03 ENCOUNTER — Emergency Department (HOSPITAL_COMMUNITY): Payer: BC Managed Care – PPO

## 2010-06-03 DIAGNOSIS — G8929 Other chronic pain: Secondary | ICD-10-CM | POA: Insufficient documentation

## 2010-06-03 DIAGNOSIS — R51 Headache: Secondary | ICD-10-CM | POA: Insufficient documentation

## 2010-06-03 DIAGNOSIS — H5702 Anisocoria: Secondary | ICD-10-CM | POA: Insufficient documentation

## 2010-06-03 DIAGNOSIS — M542 Cervicalgia: Secondary | ICD-10-CM | POA: Insufficient documentation

## 2010-06-03 DIAGNOSIS — Z79899 Other long term (current) drug therapy: Secondary | ICD-10-CM | POA: Insufficient documentation

## 2010-06-03 LAB — POCT I-STAT, CHEM 8
BUN: 11 mg/dL (ref 6–23)
Chloride: 106 mEq/L (ref 96–112)
Creatinine, Ser: 0.6 mg/dL (ref 0.4–1.2)
Hemoglobin: 12.6 g/dL (ref 12.0–15.0)
Potassium: 3.9 mEq/L (ref 3.5–5.1)
Sodium: 141 mEq/L (ref 135–145)

## 2010-06-03 MED ORDER — IOHEXOL 350 MG/ML SOLN
50.0000 mL | Freq: Once | INTRAVENOUS | Status: AC | PRN
  Administered 2010-06-03: 50 mL via INTRAVENOUS

## 2010-06-07 NOTE — Discharge Summary (Signed)
Katie Salas, Katie Salas              ACCOUNT NO.:  [id number]   MEDICAL RECORD NO.:  [id number]          PATIENT TYPE:  INP   LOCATION:  2029                         FACILITY:  [facility]   PHYSICIAN:  Salvatore Decent. Dorris Fetch, M.D.DATE OF BIRTH:  [birth year]   DATE OF ADMISSION:  11/25/2007  DATE OF DISCHARGE:  11/28/2007                               DISCHARGE SUMMARY   HISTORY:  The patient is a 45 year old female with a history of previous  gastric bypass surgery as well as gastroesophageal reflux.  Recently,  she began to develop episodes of pleuritic chest pain as well as  shortness of breath.  An evaluation including CT angiography was done to  rule out pulmonary embolus and this was negative; however, it did reveal  an anterior mediastinal soft tissue mass.  She also was noted to have  swelling in her distal esophagus as well as some thickening of the  distal esophagus.  She was treated with ibuprofen and her symptoms of  pleuritic pain did resolve, though she was referred to Dr. Dorris Fetch  for consultation due to this finding of the anterior mediastinal  abnormality.  The patient stated that she has been having some  difficulty swallowing.  She felt as though food catches in her upper  chest.  She did not have any further shortness of breath.  She did have  a history of a blood clot in her arm due to a previous PICC line  approximately 7 years ago.  This was an approximate time she had her  gastric bypass.  She denied any double vision.  She denied generalized  weakness.  She does feel as though she has less energy than normal,  however.  Due to the findings on CT angiography, it was Dr.  Sunday Corn opinion that she should undergo surgical exploration for  diagnostic and therapeutic purposes.  She was admitted this  hospitalization for the procedure.   PAST MEDICAL HISTORY:  1. Obesity.  2. History of venous thrombosis secondary to a PICC line.  3. History of gastroesophageal  reflux.  4. History of anemia.   PAST SURGICAL HISTORY:  Gastric bypass.   MEDICATIONS PRIOR TO ADMISSION:  1. Ambien 1/2 tablet nightly p.r.n.  2. Lyrica 150 mg b.i.d.  3. Percocet 10 mg 1/2-1 for breakthrough pain.  4. Morphine 30 mg b.i.d.  5. Cymbalta 30 mg daily.   ALLERGIES:  SULFA and DARVOCET.   FAMILY HISTORY:  Please see the dictated history and physical done at  the time of admission.   SOCIAL HISTORY:  Please see the dictated history and physical done at  the time of admission.   REVIEW OF SYMPTOMS:  Please see the dictated history and physical done  at the time of admission.   PHYSICAL EXAMINATION:  Please see the dictated history and physical done  at the time of admission.   HOSPITAL COURSE:  The patient was admitted electively on November 25, 2007.  She underwent a partial sternotomy with thymectomy.  She  tolerated this procedure well and was taken to the post anesthesia care  unit in stable condition.  POSTOPERATIVE HOSPITAL COURSE:  The patient has overall progressed  nicely.  Pathology did reveal thymic hyperplasia.  Biopsies of the  mediastinum lymph nodes revealed benign lymph nodes with anthracotic  pigment.  All routine lines, monitors, and drainage devices were  discontinued in the standard fashion.  She did have a mild postoperative  anemia.  Her hemoglobin and hematocrit stabilized.  Most recent on  November 27, 2007, was 11.3 and 32 respectively.  Electrolytes, BUN, and  creatinine are within normal limits.  Incision showed to be healing well  without signs of infection.  She tolerated a gradual increase in  activity using standard protocols.  Overall, her status was deemed to be  quite acceptable for discharge on November 28, 2007.   MEDICATIONS ON DISCHARGE:  1. Lyrica 75 mg 2 tablets b.i.d.  2. Morphine sulfate 30 mg b.i.d.  3. Cymbalta 30 mg at bedtime.  4. Ambien 5 mg at bedtime.  5. Oxycodone 5 mg 1 or 2 every 4 hours as needed for  pain.   INSTRUCTIONS:  The patient received written instructions regarding  medications, activity, diet, wound care, and followup.  Followup  included appointment to see Dr. Dorris Fetch 3 weeks post discharge with  a chest x-ray.   CONDITION ON DISCHARGE:  Stable and improving.   FINAL DIAGNOSIS:  Thymic hyperplasia, status post anterior mediastinal  resection.   OTHER DIAGNOSES:  1. Esophageal reflux.  2. History of obesity.  3. Mild postoperative anemia.  4. History of gastric bypass.      Rowe Clack, P.A.-C.      Salvatore Decent Dorris Fetch, M.D.  Electronically Signed    WEG/MEDQ  D:  02/04/2008  T:  02/04/2008  Job:  [id number]   cc:   Salvatore Decent. Dorris Fetch, M.D.
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
## 2010-06-07 NOTE — Assessment & Plan Note (Signed)
OFFICE VISIT   CRESTA, RIDEN  DOB:  16-Jan-1966                                        December 13, 2007  CHART #:  [id number]   The patient is a 45 year old woman, who recently was found to have an  anterior mediastinal mass on CT, it was about 1.3 x 2.5 cm in diameter.  A PET scan showed an SUV of 3.4 which definitely hypermetabolic, but was  not in malignant range.  After extensive discussion, she decided to go  ahead and have this excised, it turned out to be benign thymic  hyperplasia and she had a thymectomy to a partial sternotomy on November 25, 2007.  She now returns for postoperative followup.  She is doing well  at this point in time.  She does have some soreness.  She is taking  Percocet about half a tablet 3 times daily.  She had been on Percocet  previously for neck pain and previous cervical spine surgery and had  been taking about one Percocet a day since she is taking just a little  more than she was previously.  She is not feeling any clicking or  popping.  She has not really noted any change in energy levels or other  symptoms.   PHYSICAL EXAMINATION:  GENERAL:  The patient is a well-appearing, 45-  year-old white female in no acute distress.  VITAL SIGNS:  Her blood pressure is 125/73, pulse is 80, respirations  16, her oxygen saturation is 98% on room air.  CHEST:  Incision is healing well.  There is some spitting of the node at  the superior aspect of the sternal incision, this was excised and the  sternum is stable and the incision is healing well.   The chest x-ray shows good aeration of the lungs bilaterally.  All the  sternal wires are intact.   IMPRESSION:  The patient is doing well.  She had benign thymic  hyperplasia.  It is unclear why she had that, if it signifies anything  more important.  She did have a PET scan which did not show any other  suspicious areas.  I recommended to the patient that we see her back in  about a year and we will repeat a CT scan just to relocate that area and  make sure there is not any other recurrence or tissue growth in that  area.  She did have some lymph nodes in the region, but those were  benign on pathology as well.   The patient may drive as soon as she feels comfortable, doing so she  tried that a couple of days ago and was really too sore to do that.  She  is not to lift anything else or doing heavy work with her arms for at  least 6 weeks postoperatively and after that she will gradually build up  her activities.  Otherwise, her activities are unrestricted.   Salvatore Decent Dorris Fetch, M.D.  Electronically Signed   SCH/MEDQ  D:  12/13/2007  T:  12/14/2007  Job:  [id number]   cc:   Lavonda Jumbo, M.D.

## 2010-06-07 NOTE — Op Note (Signed)
Katie Salas, Katie Salas              ACCOUNT NO.:  [id number]   MEDICAL RECORD NO.:  [id number]          PATIENT TYPE:  INP   LOCATION:  3312                         FACILITY:  [facility]   PHYSICIAN:  Salvatore Decent. Dorris Fetch, M.D.DATE OF BIRTH:  1965-02-28   DATE OF PROCEDURE:  11/25/2007  DATE OF DISCHARGE:                               OPERATIVE REPORT   PREOPERATIVE DIAGNOSIS:  Anterior mediastinal mass.   POSTOPERATIVE DIAGNOSIS:  Thymic mass, question hyperplasia versus  thymoma.   PROCEDURE:  Partial median sternotomy and thymectomy.   SURGEON:  Salvatore Decent. Dorris Fetch, MD   ASSISTANT:  Coral Ceo, PA.   ANESTHESIA:  General.   FINDINGS:  Enlarged thymus, no invasion of surrounding structures.   CLINICAL NOTE:  Katie Salas is a 45 year old woman who recently had  pleuritic chest pain.  A CT angio was done, which showed an anterior  mediastinal soft tissue mass, this was relatively small in size and  initially we considered following this was serial CT scans; however, the  patient was extremely concerned about possibility that this could be a  malignancy.  A PET scan was performed and showed increased uptake with  an SUV of 3.4.  It was recommended that she undergo surgical resection.  The indications, risks, benefits, and alternatives were discussed in  detail with the patient.  She understood and accepted risks and agreed  to proceed and wished to proceed with a partial sternotomy approach.   OPERATIVE NOTE:  Katie Salas was brought to the preop holding area on  November 25, 2007.  The lines were placed by Anesthesia for central  venous access and arterial blood pressure monitoring.  Intravenous  antibiotics were administered.  PAS hose was placed for DVT prophylaxis.  The patient was taken to the operating room, anesthetized, and  intubated.  A Foley catheter was placed.  The chest and abdomen were  prepped and draped in usual sterile fashion.   Approximately 10-cm long  incision was made in the upper midline of the  chest, it was carried through the skin and subcutaneous tissue.  The  hemostasis was achieved with electrocautery.  The sternum was scored  with the electrocautery from the sternal notch to the third interspace.  Using the oscillating saw, an L-type sternotomy was performed to the  third interspace with the L being to the right side.  Hemostasis was  achieved.  The retractor was placed and dissection of the anterior  mediastinal tissue was carried out.  It was quickly obvious that this  was a thymic lesion and dissection was started superiorly removing the  upper poles extended anterior to the innominate vein.  Fatty tissue  superior to the innominate vein was taken separately, large feeding vein  was clipped and divided and the area superior to the innominate vein and  down to the level of the innominate artery was taken.  This was sent as  a separate specimen.  There were multiple lymph nodes within this area.  The dissection was carried more distally.  Along the inferior aspect of  the innominate vein, there was again a large  feeding vessel, which was  doubly clipped and divided.  Dissection was carried over to the left  side and once again there were multiple nodes in the pleural fat.  They  were taken along with the specimen.  The thymic tissue had a great  fleshy appearance, it was easily differentiated from the surrounding  fat.  The dissection was carried over to the pleura and the pleura was  entered.  Care was taken not to use electrocautery in the vicinity of  the phrenic nerve.  The dissection was carried along the left lateral  aspect to the inferior pole.  There was an extension of the fat pad,  which was taken along with the specimen.  There was no invasion of the  pericardium or other surrounding structures.  Next, the dissection was  carried to the right side again to the level of the phrenic nerve  removing all the fatty  tissue along with a thymic tissue and avoiding  cautery in the vicinity of the phrenic nerve.  Along the pericardium,  there were filmy adhesions which were taken with electrocautery.  Specimen was sent for frozen section, which returned thymic tissue,  question hyperplasia versus thymoma.  Again, there was no indication of  invasion in the surgical field.  Hemostasis was achieved.  There is no  residual tissue in the area of the resection.  The sternum was closed  with three interrupted simple sutures to reapproximate the left right  hemisternum.  The transverse division was likewise reapproximated with a  single heavy gauge stainless steel wire.  The pectoralis fascia,  subcutaneous tissue, and the skin were closed in standard fashion.  All  sponge, needle, and instrument counts were correct at the end of the  procedure.  The patient was extubated in the operating room and taken to  the postanesthetic care unit in good condition.      Salvatore Decent Dorris Fetch, M.D.  Electronically Signed     SCH/MEDQ  D:  11/25/2007  T:  11/26/2007  Job:  [id number]   cc:   Lavonda Jumbo, M.D.

## 2010-06-07 NOTE — Assessment & Plan Note (Signed)
OFFICE VISIT   ANACAREN, KOHAN  DOB:  May 23, 1965                                        November 13, 2007  CHART #:  [id number]   The patient is a 45 year old woman who was seen in the office on November 07, 2007, regarding an anterior mediastinal mass.  She presented with  pleuritic chest pain and shortness of breath.  A CT angio had been done  to rule out pulmonary embolus.  It showed an anterior mediastinal soft  tissue mass.  Her pleuritic pain resolved, since she was referred for  evaluation of this anterior mediastinal mass.  After discussion with  her, she was undecided regarding continued radiologic followup versus  surgical resection.  Recommended a PET scan to see if there was any  evidence this might be a malignancy or showed evidence of increased  metabolic activity.  She had the PET scan done this morning.  It showed  an anterior soft tissue mass 1.3 x 2.5 cm in diameter.  The SUV was 3.4.  There also was some increased uptake in the cervical spine consistent  with degenerative facet disease.  I discussed these findings with the  patient and her husband the official radiologic interpretation is  borderline malignant range.  FTG uptake it is indeterminate and does not  rule out that this could be a benign lesion, but there is no way to rule  out, but it is potentially a low-grade malignancy.  My recommendation  based on this finding is surgical resection.  The patient strongly  wishes to proceed with surgical resection.  We discussed potential  options including a minimally invasive VATS-type approach versus a  partial sternotomy.  She wishes to have a partial sternotomy approach.  We discussed the indications, risks, benefits, and alternatives.  She  understands the risks include, but are not limited to death, MI, DVT,  PE, bleeding, possible need for transfusions, infections, sternal  nonunion as well as other unpredictable or on foreseeable  complications.  She understands and accepts these risks  and agrees to proceed.  She wishes to have the surgery done on Monday  November 25, 2007.  She will be admitted on the day of surgery.  Expected  postoperative length of stay of 3-4 days.   Salvatore Decent Dorris Fetch, M.D.  Electronically Signed   SCH/MEDQ  D:  11/13/2007  T:  11/14/2007  Job:  [id number]   cc:   Lavonda Jumbo, M.D.

## 2010-06-07 NOTE — Consult Note (Signed)
NEW PATIENT CONSULTATION   SARENA, JEZEK  DOB:  1965/01/28                                        November 07, 2007  CHART #:  [id number]   The patient is a 45 year old woman recently found to have anterior  mediastinal mass.   HISTORY OF PRESENT ILLNESS:  The patient is a 45 year old woman with a  history of gastric bypass surgery and gastroesophageal reflux.  She  recently was having pleuritic chest pain.  She is also having some  shortness of breath.  A CT angio was done to rule out pulmonary embolus,  in fact, was negative for pulmonary embolus, but did show anterior  mediastinal soft tissue mass.  She also had some swelling in her distal  esophagus or some thickening of the distal esophagus.   She was treated with ibuprofen and her pleuritic pain resolved, but she  now presents for evaluation of this anterior mediastinal abnormality.  The patient states that she has been having some difficulty swallowing.  She feels like food catches in her upper chest.  She has not had any  additional shortness of breath.  She does have a history of blood clot  in her arm vein due to a PICC line 7 years ago.  Around that time, she  was having a gastric bypass.  She denies any double vision.  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   PAST MEDICAL HISTORY:  Significant for gastric bypass surgery, venous  thrombosis secondary to PICC line, gastroesophageal reflux, and anemia.   CURRENT MEDICATIONS:  Ambien one-half tablet nightly p.r.n., Lyrica 150  mg p.o. b.i.d., Percocet 10 mg tablets one-half to one for breakthrough  pain, morphine 30 mg p.o. b.i.d., and Cymbalta 30 mg daily.   ALLERGIES:  She has allergies to sulfa and Darvocet.   FAMILY HISTORY:  Noncontributory, although her father did have  cardiovascular disease  in early age.   SOCIAL HISTORY:  She is a nonsmoker, nondrinker.  She is married,  homemaker with two children ages 51 and 55.   REVIEW OF SYSTEMS:  See HPI, otherwise negative.   PHYSICAL EXAMINATION:  GENERAL:  The patient is a 45 year old white  female in no acute distress.  VITAL SIGNS:  Blood pressure is 142/91, pulse 76, respirations are 18,  her oxygen saturation is 100% on room air.  NEUROLOGIC:  She is alert, oriented x3 with no focal deficits.  HEENT:  Unremarkable.  NECK:  Supple without thyromegaly, adenopathy, or bruits.  There is no  supraclavicular adenopathy.  CARDIAC:  Regular rate and rhythm.  Normal S1 and S2.  LUNGS:  Clear with equal breath sounds.  EXTREMITIES:  Without clubbing, cyanosis, or edema.   LABORATORY DATA:  A CT of the chest shows distal esophageal thickening.  It shows no evidence of pulmonary embolus.  There is normal anterior  mediastinal soft tissue mass with the differential diagnosis including  lymphoma, thymoma, ectopic thyroid, or thymic hyperplasia.  D-dimer was  0.3, glucose 81, BUN and creatinine 10 and 0.6, albumin 3.0, hematocrit  35, platelets 164 with white count is 3.7.   IMPRESSION:  The patient is a 45 year old woman with a  newly discovered  anterior mediastinal soft tissue mass.  Differential diagnosis includes  with an ectopic thyroid.  This is not connected to her thyroid gland.  It is really in the position of the thymus gland.  It could be thymic  hyperplasia or benign or malignant thymoma.  There is also a possibility  that this could represent a lymphoma.  We discussed her diagnostic  options, one of which would be to just wait and repeat a scan in about 2-  3 months; second option would be to proceed with a PET scan to help  guide [place] whether this is something that is aggressive, certainly could  be a lymphoma or malignant thymoma.  PET scan would be useful for  determining that.  If the PET scan more hypermetabolic, then  certainly  this mass would need to be excised.  The third option would be to  proceed straight with surgery with the two options being sternotomy or  partial sternotomy approach anteriorly or approach with a minimally  invasive VATS type approach from a lateral approach.  We discussed the  relative merits of each of these approaches.  I promptly recommended her  that we proceed with the PET scan to help guide [place] in our decision  making as to whether or not this needs to be removed and also it could  help guide [place] as to whether or not a more minimally invasive approach  might be appropriate verses a more traditional anterior approach for  resection of this were malignant.  The patient understand the issues  involved and she wishes to proceed with PET scan.  We will plan to see  her back once we have those results to determine the next.   Salvatore Decent Dorris Fetch, M.D.  Electronically Signed   SCH/MEDQ  D:  11/07/2007  T:  11/08/2007  Job:  [id number]   cc:   Lavonda Jumbo, M.D.

## 2010-06-07 NOTE — Assessment & Plan Note (Signed)
OFFICE VISIT   DEMARI, KROPP  DOB:  March 31, 1965                                        December 15, 2008  CHART #:  [id number]   REASON FOR VISIT:  Followup 1 year after thymectomy.   HISTORY OF PRESENT ILLNESS:  The patient is a 45 year old woman who had  an anterior mediastinal mass discovered on CT scan about a year ago that  was hypermetabolic on PET with an SUV of 3.4.  This was excised and  turned out to be benign thymic hyperplasia.  She had a thymectomy via  partial sternotomy.  Multiple lymph nodes were taken, those were benign  as well.  We did advise her to have a CT scan in 1 year just to rule out  any recurrence.  She has not had any scans postoperatively prior to the  present time.   The patient states that since I last saw her she still has some  occasional pains, particularly on the left side of the sternum right  where the ribs insert at the lower portion of the incision.  There is  also a small knot at the lower inferior aspect of the incision.  This is  not painful but is irritating to her feet.  She occasionally feels a  click or a pop movement in the sternal area.  Other than that, she has  felt well and has no complaints.   The patient's medical history form is reviewed and is on the chart.   PAST MEDICAL HISTORY:  Obesity, venous thrombosis, gastroesophageal  reflux, and anemia, as well as benign thymic hyperplasia.   CURRENT MEDICATIONS:  1. Ambien one-half tablet at bedtime.  2. Lyrica.  3. Cymbalta.  4. Morphine.  5. Percocet.   REVIEW OF SYSTEMS:  Negative other than previously noted items.   PHYSICAL EXAMINATION:  Her incision is well healed.  There is a small  amount of hypertrophic scar tissue at the inferior most aspect, likely  around where a suture had been present.  There is no sign of infection.  There is no palpable foreign body.  The sternum itself is stable.  She  does have point tenderness along the  costal margin at approximately the  third costal cartilage.  Cardiac exam is a regular rate and rhythm.  Normal S1 and S2.  There is no palpable cervical or supraclavicular  adenopathy.  Lungs are clear.   DIAGNOSTIC TESTS:  CT scan is reviewed and compared to her previous scan  preoperatively, the mass itself obviously has gone.  There is a  triangular-shaped soft tissue density anterior to the aorta in the  postoperative region.   IMPRESSION:  The patient is overall doing well.  She still has some  aches and pains.  I told her it is possible to go back in and take out  the sternal wires if she is having any kind of severe pain issues.  She  certainly does not want to do that at this point in time.  I am not the  least bit concerned about this whole knot, its cosmetic issue only.  The  incision itself is well healed and the sternum is well healed both by  exam and CT scan.  I reviewed the CT scan with her, showed her this soft  tissue density that  the radiologist could not rule out with some  residual thymus tissue.  Basically, this tissue just fills the space  where the thymus was resected and may be some mediastinal fat  potentially could be a little bit of residual thymus tissue, but she is  not having any symptoms that would warrant reexploration.  I recommended  to her that we repeat a CT scan in about 6 months just to make sure that  this is not growing or changing in any way, but otherwise it is  completely safe to follow this at this time.   Salvatore Decent Dorris Fetch, M.D.  Electronically Signed   SCH/MEDQ  D:  12/15/2008  T:  12/16/2008  Job:  [id number]   cc:   Lavonda Jumbo, M.D.

## 2010-06-07 NOTE — Op Note (Signed)
NAMEMAHLANI, BERNINGER              ACCOUNT NO.:  [id number]   MEDICAL RECORD NO.:  [id number]          PATIENT TYPE:  OIB   LOCATION:  5005                         FACILITY:  [facility]   PHYSICIAN:  Alvy Beal, MD    DATE OF BIRTH:  Oct 17, 1965   DATE OF PROCEDURE:  10/17/2006  DATE OF DISCHARGE:                               OPERATIVE REPORT   PREOPERATIVE DIAGNOSIS:  Degenerative cervical disk disease,  degenerative cervical facet disease, C5-6 C6-7 with chronic neck pain.   POSTOPERATIVE DIAGNOSIS:  Degenerative cervical disk disease,  degenerative cervical facet disease, C5-6 C6-7 with chronic neck pain.   OPERATIVE PROCEDURE:  Anterior cervical diskectomy and fusion C5-6 and  C6-7 with Synthes anterior cervical plate size 30 with 14 mm screws and  a 6 mm parallel fibular allograft strut at C5-6 and a 7-mm lordotic  graft at C6-7.   COMPLICATIONS:  None.   CONDITION:  Stable.   HISTORY:  This is a very pleasant 45 year old woman who has had chronic  neck pain for the last three years.  Despite injection and physiotherapy  and appropriate conservative management the patient's pain has only  intensified.  After discussing treatment options she elected to proceed  with surgery.  All appropriate risks, benefits and alternatives were  explained to the patient.  Consent was obtained.   OPERATIVE NOTE:  The patient was brought to the operating room, placed  supine on the operating table.  After successful induction of general  anesthesia, endotracheal intubation, Ted hose SCD's were applied as well  as a Foley.  The arms were secured at the side.  The neck was placed in  gentle extension and the anterior cervical spine was prepped and draped  in a standard fashion.  A horizontal incision was made over the C5-6  disk space just inferior to the thyroid cartilage.  Sharp dissection was  carried out down to and through the platysma.  I then bluntly dissected  in the deep cervical  fascia along the medial border of  sternocleidomastoid.  The carotid sheath was visualized and protected  with a finger laterally and the esophagus and trachea were swept  medially.  I then could palpate and visualize the anterior longitudinal  ligament.  I then placed a needle into the 5-6 disk space, took an x-ray  and confirmed that we were at the appropriate level.  Once confirmed, I  then began to mobilize the longus coli muscles laterally to the level of  the uncovertebral joints bilaterally with bipolar electrocautery.  At  this point with adequate anterior exposure of the cervical spine, I then  placed a Caspar retracting system into the wound.  I deflated the  endotracheal cuff, expanded the blades and reinflated the endotracheal  cuff.  Caspar distraction pins were placed into the bodies of C5, C6, C7  and I distracted the 5-6 disk space.   A 15 blade scalpel was used to incise the annulus and then I used a  combination of pituitary and Kerrison rongeurs as well as curettes to  resect the entire disk material.  I then  resected the posterior annulus  as well.  At this point, having completely removed the disk material and  had excellent bleeding end plates.  I then repeated this entire  procedure at the C6-7 disk space.  At this point with both disks out and  having an  adequate fusion, I then elected not to proceed with resecting  the posterior longitudinal ligament as she had no neurological deficits  and I was able to perform a foraminotomy by resecting the bone spurs at  the uncovertebral joint.   The interbody devices were measured, packed with synthetic bone graft  and then malleted to the appropriate resting position.  I confirmed  position and trajectory of the grafts with x-ray.  I then placed a  Vector anterior cervical Synthes plate and affixed it to the bodies of C-  5 and C-7 with 14-mm screws.  I then placed the C-6 screws and made sure  all screws were locked  into place.  I then irrigated the wound copiously  with normal saline, removed the retractors and then evaluated the left  right side border to ensure the trachea and esophagus were not entrapped  beneath the plate.  Once confirmed, I returned the trache and esophagus  to midline, irrigated the wound copiously with normal saline, obtained  hemostasis using bipolar electrocautery, closed the platysma with  interrupted 2-0 Vicryl sutures, skin with 3-0 Monocryl.  Steri-Strips,  dry dressing and an Aspen collar applied.   The patient was extubated, transferred to PACU without incident.  At the  end of the case all needle, sponge and instrument counts were correct.      Alvy Beal, MD  Electronically Signed     DDB/MEDQ  D:  10/17/2006  T:  10/18/2006  Job:  [id number]

## 2010-06-10 NOTE — H&P (Signed)
[facility]  Patient:    Katie Salas, Katie Salas Visit Number: [id number] MRN: [id number]          Service Type: Attending:  Duke Salvia. Marcelle Overlie, M.D. Dictated by:   Duke Salvia. Marcelle Overlie, M.D. Adm. Date:  05/28/01                           History and Physical  CHIEF COMPLAINT:              Abnormal uterine bleeding and endometrial polyps.  HISTORY OF PRESENT ILLNESS:   A 45 year old with a 57 year old.  She conceived previously with HMG.  After a C-section, she had left upper extremity phlebitis secondary to a PICC line.  She did have postpartum screening for coagulopathy, which was normal per her medical doctor.  Due to some problems with heavy irregular bleeding, she underwent recent SHT in our office that showed irregular tissue buildup and possible endometrial polyps.  She presents now for [facility] and hysteroscopy.  This procedure, including risks of bleeding, infection, and other complications, such perforation that may require additional surgery, were all reviewed with her.  ALLERGIES:                    SULFA and DARVOCET.  REVIEW OF SYSTEMS:            Significant for infertility and chronic anovulation.  PAST SURGICAL HISTORY:        1. Ankle surgery.                               2. Knee surgery.                               3. Uterine septum removed seven years ago by                                  Loistine Chance C. Elesa Hacker, M.D.                               4. Cesarean section.  PHYSICAL EXAMINATION:         Temperature 98.2 degrees, blood pressure 128/80.  HEENT:                        Unremarkable.  NECK:                         Supple without mass.  LUNGS:                        Clear.  CARDIOVASCULAR:               Regular rate and rhythm without murmurs, rubs, or gallops.  BREASTS:                      Without masses.  ABDOMEN:                      Soft, flat, and nontender.  PELVIC:  Normal external genitalia.   Vagina and cervix clear.  Uterus mid position and normal size.  Adnexa negative.  Exam difficult due to her weight of 335 pounds.  IMPRESSION:                   Abnormal uterine bleeding, possible endometrial polyps noted on sonohysterogram.  PLAN:                         D&C and hysteroscopy.  The procedure and risks were discussed as above. Dictated by:   Duke Salvia. Marcelle Overlie, M.D. Attending:  Duke Salvia. Marcelle Overlie, M.D. DD:  05/22/01 TD:  05/22/01 Job: [id number]
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
## 2010-06-10 NOTE — Procedures (Signed)
NAMEMAKINLEE, AWWAD              ACCOUNT NO.:  [id number]   MEDICAL RECORD NO.:  [id number]          PATIENT TYPE:  REC   LOCATION:  TPC                          FACILITY:  [facility]   PHYSICIAN:  Celene Kras, MD        DATE OF BIRTH:  May 31, 1965   DATE OF PROCEDURE:  09/26/2005  DATE OF DISCHARGE:                                 OPERATIVE REPORT   PATIENT:  Katie Salas.   DATE OF BIRTH:  September 29, 1965.   SURGEON:  Jewel Baize. Stevphen Rochester, M.D.    1. Katie Salas comes to [place] today demonstrating positive predictive experience      to the facetal medial branch injection.  Based on this information with      improvement in function, quality of life indices and restorative sleep      capacity, it is reasonable to go on to RF.  We plan the third occipital      nerve 4, 5, 6 and 7, with contributory innervation addressed.  This      will be under local anesthetic with a 5-mm active tip.  I have reviewed      the risks, complications and options.  This includes but is not      exclusive to, bleeding, infection, nerve damage, stroke, seizure,      death, other unforeseen problems not commonly encountered, particularly      idiosyncratic reaction to medication, increase in pain, or no relief of      pain.  She wishes to proceed.  She will return to Dr. Ethelene Hal for the      biomechanical model.  2. I have also told her that many times we have to reinforce this.  She      understands this, and many questions were answered.  3. No advancing neurological features.  She has had a recent MRI with no      change.   OBJECTIVE:  Diffuse paracervical myofascial discomfort and positive cervical  facetal compression test, left greater than right.  Range of motion impaired  secondary to pain, suprascapular, levator scapular extension, her typical  pain.  Side bending positive.  No new neurological findings.   IMPRESSION:  Cervical facet syndrome with myelopathy.   PLAN:  Cervical facet medial branch  radiofrequency neuroablation, C4, C5, C6  and C7, contributory innervation addressed at C3, under local anesthetic, 5  mm active tip, and she is consented.   PROCEDURE:  The patient taken to the fluoroscopy suite and placed in supine  position, neck prepped and draped in the usual fashion.  Using a 22-gauge RF  needle with a 5 mm active tip, I advanced to the cervical facet at the  medial branch, C3, C4, C5, C6 and C7, with contributory innervation  addressed, pulse C3 and with thermal to the remaining segments.  After  confirmation of placement in multiple fluoroscopic positions, at multiple  points during the procedure, we motor-sensory stimulate, followed by 0.5 mL  of lidocaine 1% MPF at each level.  We then follow with a 60-second lesion  at each point.  Tolerated the procedure well.  No complications from our procedure.  Discharge instructions given.  We will see her in follow-up.  This will be  through Dr. Ethelene Hal' recommendation.  She understands this may be needed to be  repeated  at some point, it does have a life Salas, and that our best hope is 25-50%  but more relief cycling, more important, improved range of motion,  functional indices and quality of life indices.           ______________________________  Celene Kras, MD     HH/MEDQ  D:  09/26/2005 12:03:25  T:  09/26/2005 13:10:54  Job:  [id number]   cc:   Caralyn Guile. Ethelene Hal, M.D.  Fax: [phone]

## 2010-06-14 ENCOUNTER — Encounter (INDEPENDENT_AMBULATORY_CARE_PROVIDER_SITE_OTHER): Payer: BC Managed Care – PPO | Admitting: Thoracic Surgery (Cardiothoracic Vascular Surgery)

## 2010-06-14 ENCOUNTER — Ambulatory Visit
Admission: RE | Admit: 2010-06-14 | Discharge: 2010-06-14 | Disposition: A | Discharge status: Home / Self Care | Payer: BC Managed Care – PPO | Source: Ambulatory Visit | Attending: Thoracic Surgery (Cardiothoracic Vascular Surgery) | Admitting: Thoracic Surgery (Cardiothoracic Vascular Surgery)

## 2010-06-14 DIAGNOSIS — D150 Benign neoplasm of thymus: Secondary | ICD-10-CM

## 2010-06-14 DIAGNOSIS — J9859 Other diseases of mediastinum, not elsewhere classified: Secondary | ICD-10-CM

## 2010-06-14 MED ORDER — IOHEXOL 300 MG/ML  SOLN
75.0000 mL | Freq: Once | INTRAMUSCULAR | Status: AC | PRN
  Administered 2010-06-14: 75 mL via INTRAVENOUS

## 2010-06-14 NOTE — Op Note (Signed)
NAMECAMBRI, Katie Salas              ACCOUNT NO.:  [id number]   MEDICAL RECORD NO.:  [id number]          PATIENT TYPE:  AMB   LOCATION:  DSC                          FACILITY:  [facility]   PHYSICIAN:  Earvin Hansen L. Truesdale, M.D.DATE OF BIRTH:  1965-07-15   DATE OF PROCEDURE:  04/18/2004  DATE OF DISCHARGE:                                 OPERATIVE REPORT   HISTORY OF PRESENT ILLNESS:  This is a 45 year old lady with severe  macromastia, back and shoulder pain secondary to pendulous breasts.  History  of conservative treatment with no avail.  The patient also has a history of  rashes under the breast tissue secondary to dryness, heat and fungus  infections.   PROCEDURES:  Bilateral breast reductions using the inferior pedicle  technique.   ANESTHESIA:  General.  Preoperatively, the patient was stood up and drawn  for the inferior pedicle reduction mammoplasty, remarking the nipple areolar  complexes back to 20 cm from the suprasternal notch.  She then underwent  general anesthesia.  Prep was down to the chest breast area in routine  fashion using Hibiclens soap and solution walled off with sterile towels and  a drape so as to make a sterile field.  One 4% Xylocaine with epinephrine  1:400,000 concentration was injected locally for a total of 150 cc per side.  This was allowed to sit for hemostasis.  The wounds were then scored with  #15 blades.  Skin over the inferior pedicle was epithelialized with #20  blades.  A medial and lateral fatty dermal pedicle was then sized down to  the myofascial.  There was some increased capillary bleeding in the  arterials.  These were secured nicely with above unit of coagulation.  After  this, the lateral tissues was removed.  A new keyhole area was debulked.  The fats were advanced and stayed with 3-0 Prolene.  Subcutaneous closure  was done with 3-0 Monocryl x2 layers then a running subcuticular stitch with  3-0 Monocryl FI with Monocryl throughout the  inverted T.  The wounds were  cleansed.  Steri-Strips and soft dressings were applied.  The wounds were  drained with a #10 Blake drain fully fluted, which was placed in the depths  of the wound then brought out through the lateral most portion of the  incision as occurred with 3-0 Prolene.  Steri-Strips and soft dressing were  applied.  She was then taken to recovery in excellent condition.   ESTIMATED BLOOD LOSS:  Less than 100 cc.   COMPLICATIONS:  None.      GLT/MEDQ  D:  04/18/2004  T:  04/18/2004  Job:  [id number]

## 2010-06-14 NOTE — Discharge Summary (Signed)
[facility]  Patient:    Katie Salas, Katie Salas                       MRN: [id number] Adm. Date:  [id number] Disc. Date: 05/08/00 Attending:  Frederich Balding                           Discharge Summary  ADMITTING DIAGNOSES:          Pregnancy at 27+ weeks with pregnancy induced hypertension.  DISCHARGE DIAGNOSES:          Pregnancy at 27+ weeks with pregnancy induced hypertension, findings of worsening pregnancy induced hypertension.  OPERATIVE PROCEDURE:          Low transverse cesarean section.  HOSPITAL COURSE:              For admission history and physical please see written note.  Patient was admitted with elevated blood pressures.  Her initial laboratory work was basically normal except for an elevated uric acid of 8.1.  All liver function tests and platelets were within normal limits. Subsequently, a 24 hour urine revealed a spillage of 492 mg of protein with a creatinine clearance of 145.  She was begun on atenolol for continued elevated blood pressures.  They ran in the systolics of 180-190 over diastolics of 86/108.  By blood pressure she was consistent with severe PIH.  Fetal monitoring included an ultrasound which revealed no evidence of intrauterine growth retardation and normal amniotic fluid.  Subsequently, the 24 hour urine was rechecked during the hospitalization which now increased to 2070 g of protein in a 24 hour period.  She was given betamethasone and due to worsening proteinuria and continued problems with blood pressure, underwent a primary low transverse cesarean section on ______ to deliver of an 805 g female.  pH was 7.24.  Apgars not noted.                                After delivery the patient was maintained on magnesium sulfate.  Because of continued blood pressures maintained on Aldomet and atenolol.  All of her laboratories were basically normal.  She did have a decreasing hemoglobin and prior to discharge on April 15  her hemoglobin was 8.1.  She was begun on iron sulfate supplementation.  Liver function tests as well as platelets were normal.  At the time of discharge her blood pressure was 128/80.  Other vital signs were stable.  She denied any PIH symptomatology.  Abdomen was soft, nontender.  Bowel sounds were active.  Low transverse incision was intact with slight drainage and staples in place.  She was ______ without difficulty and had no ______.  COMPLICATIONS:                Noted above.  CONDITION ON DISCHARGE:       Patient discharged home in stable condition.  DISPOSITION:                  Patient to continue Aldomet and atenolol at home as well as iron sulfate supplementation.  She will be followed up in the office on Thursday for reevaluation.  She will call with any signs of worsening PIH.  Will also call with signs of infection or heavy bleeding.  She is instructed to avoid heavy lifting, vaginal entrance, or driving of a car.  DD:  05/08/00 TD:  05/08/00 Job: [id number]

## 2010-06-14 NOTE — Op Note (Signed)
[facility]  Patient:    RAYVN, RICKERSON Visit Number: [id number] MRN: [id number]          Service Type: DSU Location: [facility] Attending Physician:  Rhina Brackett Dictated by:   Duke Salvia. Marcelle Overlie, M.D. Proc. Date: 05/28/01 Admit Date:  05/28/2001                             Operative Report  PREOPERATIVE DIAGNOSES:       Abnormal uterine bleeding, endometrial polyps.  POSTOPERATIVE DIAGNOSES:      Abnormal uterine bleeding, endometrial polyps.  PROCEDURE:                    Dilatation and curettage, hysteroscopy.  SURGEON:                      Duke Salvia. Marcelle Overlie, M.D.  ANESTHESIA:                   Paracervical block plus sedation.  PROCEDURE AND FINDINGS:       Patient went to the operating room.  After an adequate level of sedation was obtained, perineum and vagina were prepped and draped in the usual manner for D&C, hysteroscopy.  The bladder was drained. EUA carried out.  Was normal size, mid position.  Adnexa negative.  Speculum was positioned.  Cervix grasped with a tenaculum.  Paracervical block created by infiltrating at 3 and 9 oclock submucosally Xylocaine 5-7 cc 1% on either side after negative aspiration.  The uterus sounded to 9 cm, progressively dilated to a 29 Pratt.  The 7 mm continuous flow diagnostic hysteroscope was then inserted.  A large amount of tissue was noted making visualization very difficult.  Scope was removed.  D&C was carried out.  A large amount of tissue and several polypoid appearing areas of tissue were removed.  When no further tissue could be removed the scope was reinserted.  The cavity was irrigated and noted to be clean.  She tolerated this well.  Went to recovery room in good condition. Dictated by:   Duke Salvia. Marcelle Overlie, M.D. Attending Physician:  Rhina Brackett DD:  05/28/01 TD:  05/29/01 Job: [id number]

## 2010-06-14 NOTE — Op Note (Signed)
Katie Salas, Katie Salas                          ACCOUNT NO.:  [id number]   MEDICAL RECORD NO.:  [id number]                   PATIENT TYPE:  AMB   LOCATION:  DSC                                  FACILITY:  [facility]   PHYSICIAN:  Earvin Hansen L. Shon Hough, M.D.           DATE OF BIRTH:  1965-07-18   DATE OF PROCEDURE:  07/13/2003  DATE OF DISCHARGE:                                 OPERATIVE REPORT   REFERRING PHYSICIAN:  Rockie Neighbours, M.D.   PROCEDURES PERFORMED:  1. Panniculectomy.  2. Repair of incisional hernia.  3. Incisional lipomatous mass.  4. Repair of severe diastasis recti 4+.   SURGEON:  Yaakov Guthrie. Shon Hough, M.D.   ANESTHESIA:  General anesthesia.   INDICATIONS FOR PROCEDURE:  This is a 45 year old lady who was status post  gastric bypass surgery in the past by Dr. Jayme Cloud in [place], [place].  The patient has lost a total of 150 pounds, now has increased  panniculus involving her abdomen and sagging down almost mid thigh to the  knees.  She has increased intertriginous and rash changes under the tissue  causing undue pain, discomfort, and irritation.  She also has increased  pulling of the area on her lower back causing problems, causing her to use  different types of girdles and apparatuses to keep the areas up.  The  patient also demonstrates incisional hernia in the mid portion of the  abdomen just above the umbilicus measured approximately 4 x 6 cm.  It is  tender and easily palpable.  It is right in the middle of the previously  placed transverse incision for the gastric bypass.  She also has lipomatous  mass involving her right abdomen palpable measuring approximately 2 x 3.5 to  4 cm.  Also the patient demonstrates increased diastasis recti involving the  upper and lower muscles.   DESCRIPTION OF PROCEDURE:  Preoperatively, the patient was stood up and  drawn for the W plasty incision for the panniculectomy.  She then underwent  general anesthesia  and intubated orally.  Prep was done to the chest,  breast, abdominal, groin, and thigh areas using Hibiclens soap and solution,  walled off with sterile towels and drapes so as to make a sterile field.  A  Foley catheter was placed sterilely.  After this, __________ was injected  throughout the whole area, a total of 500 mL.  The incisions were opened  with #15 blade down to underlying subcutaneous tissue.  Hemostasis was  maintained with the Bovie unit on coagulation.  Next, the dissection was  carried through Scarpa's fascia to the underlying areolar tissue overlying  the rectus and abdominis musculature and the external obliques.  Dissection  was carried out to the umbilicus.  Hemostasis was maintained with the Bovie  unit on coagulation.  Laterally, more excess tissue was taken laterally over  the abdominal hip area.  The umbilicus was released on its own  pedicle and  then dissection was carried up to the xyphoid process as well as the right  and left upper quadrants.  After proper hemostasis, the rectus abdominis  muscle was reclosed to the lower portion with #1 Prolene to plicate it to  the midline.  An incisional hernia was demonstrated in the midline just  above the umbilicus.  Using the Metzenbaum scissors, I was able to dissect  down to hernia sac and then the fascial edges were then reapproximated with  interrupted #1 Prolene sutures.  Next, a large lipomatous mass was removed  from the right lower upper quadrant area using Metzenbaum scissors.  Hemostasis was maintained with Bovie unit on coagulation.  After hemostasis,  the patient was placed in jack-knife position.  The rectus abdominis  diastasis was repaired in upper port with #1 running Prolene suture.  Excess  tissue was removed with large blocks.  After proper hemostasis, the  subcutaneous closure was done with 0 Vicryl x2 layers.  A subcuticular,  subdermal suture of 2-0 Monocryl then a running subcuticular suture of  3-0  Monocryl.  The wounds were drained with a large Hemovac drain was placed in  the depths of the wound and brought through the pubic area, secured with 4-0  Prolene.  A new opening was made for the umbilicus.  It was brought back  through its opening and secured with 2-0 Monocryl subcutaneous and a running  4-0 Prolene.  The wounds were cleansed.  Sterile dressing was applied to all  areas.  Abdominal support.  She withstood the procedures very, very well and  was taken to the recovery room in excellent condition.   COMPLICATIONS:  None.   ESTIMATED BLOOD LOSS:  200 to 250 mL.                                               Yaakov Guthrie. Shon Hough, M.D.    GLT/MEDQ  D:  07/13/2003  T:  07/14/2003  Job:  [id number]   cc:   Rockie Neighbours  [address]  [place]  [place] [address]  Fax: [phone]
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
## 2010-06-14 NOTE — Op Note (Signed)
[facility]  Patient:    Katie Salas, Katie Salas                       MRN: [id number] Proc. Date: 05/03/00 Adm. Date:  [id number] Attending:  Frederich Balding                           Operative Report  PREOPERATIVE DIAGNOSES:       1. A 28-week intrauterine pregnancy.                               2. Transverse lie.                               3. Chronic hypertension.                               4. Superimposed preeclampsia.  POSTOPERATIVE DIAGNOSES:      1. A 28-week intrauterine pregnancy.                               2. Transverse lie.                               3. Chronic hypertension.                               4. Superimposed preeclampsia.  OPERATION:                    Classical cesarean section.  SURGEON:                      Duke Salvia. Marcelle Overlie, M.D.  ASSISTANTWilley Blade, M.D.  ANESTHESIA:                   Spinal anesthesia.  COMPLICATIONS:                None.  DRAINS:                       Foley catheter.  ESTIMATED BLOOD LOSS:         1000 cc.  DESCRIPTION OF PROCEDURE:     The patient was taken to the operating room and after an adequate level of spinal anesthetic was obtained, with the patient supine and in the left tilt position, the abdomen was prepped and draped in the usual manner for sterile abdominal procedures.  Foley catheter positioned, draining clear urine.  Transverse incision was made two fingerbreadths above the symphysis, carried down to the fascia which was excised and extended transversely.  The rectus muscle divided in the midline. Peritoneum entered superiorly without incident and extended in vertical manner.  The uterus itself was consistent with 26-week size.  Decision was made to proceed with classical cesarean section.  A vertical incision was made with the scalpel, and the incision probed with the surgeons finger until the amniotic space was identified. This was extended  vertically with  bandage scissors.  We did not encroach on the bladder flap area which was well below.  Clear fluid noted. Fetus in the transverse position.  An 805 g female fetus was then delivered in good condition. Apgars still pending per NICU.  The pH was sent. The infant was suctioned, cord clamped and passed to the pediatric team for further care. Placenta delivered manually intact, sent to pathology.  The uterine cavity was wiped clean with laparotomy pack.  Closure obtained of the first layer 0 chromic in locked fashion followed by an imbricating layer of 0 chromic.  A third layer of interrupted 0 chromic sutures was used for hemostasis. Tubes and ovaries were normal on either side.  The _________ was placed across the uterine incision site.  Prior to closure, sponge, needle and instrument counts were reported as correct x 2.  The rectus muscle reapproximated with 3-0 Dexon interrupted sutures.  Fascia closed from laterally to midline on either side with a 0 Panacryl suture. The subcutaneous tissue was hemostatic.  Clips and Steri-Strips were used on the skin.  She tolerated this well and went to the recovery room in good condition.  She will be started on magnesium sulfate postoperatively. DD:  05/03/00 TD:  05/03/00 Job: [id number]

## 2010-06-15 NOTE — Assessment & Plan Note (Signed)
OFFICE VISIT  Katie Salas, [name] DOB:  09-19-1965                                        Jun 14, 2010 CHART #:  [id number]  REASON FOR VISIT:  Two-year follow-up after thymectomy.  Katie Salas is a 45 year old woman, who presented back in 2009 with chest pain and shortness of breath.  She had a CT angio done to rule out pulmonary embolus and this showed an anterior mediastinal soft tissue mass.  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 There is some background concern that this could represent a thymoma. She had a CT scan a year ago for 1-year follow-up which showed no evidence of recurrent mediastinal mass.  There was an old soft tissue density probably fatty tissue in the anterior mediastinum.  She now returns for 1-year follow-up just to make sure that there is no evidence of anterior mediastinal mass or change in the soft tissue density.  Says she has been feeling well.  She has no complaints.  She has not had any chest pain or shortness of breath.  She has not had any problems related to her incision.  CURRENT MEDICATIONS: 1. Morphine 30 mg b.i.d. 2. Percocet 10 mg p.r.n. for breakthrough pain. 3. Celebrex 200 mg b.i.d. 4. Ambien half a tablet p.o. at bedtime.  ALLERGIES:  She has allergies to DARVOCET and SULFA.  PHYSICAL EXAMINATION:  Her sternotomy incision is well-healed.  CT of the chest reviewed shows no change in the soft tissue density anterior mediastinum.  This is likely just residual fatty tissue, could be little bit of residual thymic tissue, it is unchanged in 2 years now. There is no reason for any further investigation or follow-up scans unless she were to have recurrent symptoms.  I would be happy to see her back at anytime in the future if I could be of any further assistance with her care.  Salvatore Decent Dorris Fetch, M.D. Electronically  Signed  SCH/MEDQ  D:  06/14/2010  T:  06/15/2010  Job:  [id number]

## 2010-10-24 LAB — COMPREHENSIVE METABOLIC PANEL
BUN: 10
CO2: 29
Calcium: 8.4
Creatinine, Ser: 0.62
GFR calc Af Amer: >60
GFR calc non Af Amer: >60
Glucose, Bld: 80
Total Bilirubin: 0.6

## 2010-10-24 LAB — CBC
HCT: 38.9
Hemoglobin: 13
MCHC: 33.5
MCV: 86.5
RBC: 4.49

## 2010-10-24 LAB — APTT: aPTT: 34

## 2010-10-24 LAB — URINALYSIS, ROUTINE W REFLEX MICROSCOPIC
Bilirubin Urine: NEGATIVE
Hgb urine dipstick: NEGATIVE
Ketones, ur: NEGATIVE
Nitrite: NEGATIVE
Urobilinogen, UA: 0.2

## 2010-10-24 LAB — PROTIME-INR
INR: 1.1
Prothrombin Time: 14

## 2010-10-25 LAB — COMPREHENSIVE METABOLIC PANEL
ALT: 20
BUN: 6
CO2: 26
Calcium: 7.9 — ABNORMAL LOW
Creatinine, Ser: 0.48
GFR calc non Af Amer: >60
Glucose, Bld: 83
Total Protein: 4.5 — ABNORMAL LOW

## 2010-10-25 LAB — CBC
HCT: 32 — ABNORMAL LOW
HCT: 33.2 — ABNORMAL LOW
Hemoglobin: 10.9 — ABNORMAL LOW
Hemoglobin: 11.3 — ABNORMAL LOW
MCHC: 35.2
MCV: 85.4
RBC: 3.75 — ABNORMAL LOW
RBC: 3.78 — ABNORMAL LOW
RDW: 13.4
RDW: 13.7

## 2010-10-25 LAB — BLOOD GAS, ARTERIAL
Acid-base deficit: 0.3
Bicarbonate: 24.5 — ABNORMAL HIGH
TCO2: 25.9
pCO2 arterial: 45
pH, Arterial: 7.355

## 2010-10-25 LAB — BASIC METABOLIC PANEL
CO2: 27
Calcium: 8 — ABNORMAL LOW
GFR calc Af Amer: >60
GFR calc non Af Amer: >60
Glucose, Bld: 101 — ABNORMAL HIGH
Potassium: 4.2
Sodium: 136

## 2010-11-03 LAB — URINALYSIS, ROUTINE W REFLEX MICROSCOPIC
Glucose, UA: NEGATIVE
Protein, ur: NEGATIVE
Specific Gravity, Urine: 1.027
Urobilinogen, UA: 1

## 2010-11-03 LAB — BASIC METABOLIC PANEL
CO2: 26
Chloride: 108
Creatinine, Ser: 0.59
GFR calc Af Amer: >60
Potassium: 3.6
Sodium: 141

## 2010-11-03 LAB — CBC
Hemoglobin: 11.9 — ABNORMAL LOW
MCV: 78.3
RBC: 4.64
WBC: 5.7

## 2010-11-03 LAB — PROTIME-INR: Prothrombin Time: 13.8

## 2010-12-19 ENCOUNTER — Encounter (INDEPENDENT_AMBULATORY_CARE_PROVIDER_SITE_OTHER): Payer: BC Managed Care – PPO | Admitting: Ophthalmology

## 2010-12-19 DIAGNOSIS — B399 Histoplasmosis, unspecified: Secondary | ICD-10-CM

## 2010-12-19 DIAGNOSIS — H2510 Age-related nuclear cataract, unspecified eye: Secondary | ICD-10-CM

## 2010-12-19 DIAGNOSIS — H43819 Vitreous degeneration, unspecified eye: Secondary | ICD-10-CM

## 2011-09-18 ENCOUNTER — Ambulatory Visit (INDEPENDENT_AMBULATORY_CARE_PROVIDER_SITE_OTHER): Payer: BC Managed Care – PPO | Admitting: Ophthalmology

## 2011-11-23 ENCOUNTER — Telehealth: Payer: Self-pay | Admitting: Oncology

## 2011-11-23 NOTE — Telephone Encounter (Signed)
Pt called and wants to r/s missed appt from February 2012, lab and ML

## 2011-11-27 ENCOUNTER — Ambulatory Visit (HOSPITAL_BASED_OUTPATIENT_CLINIC_OR_DEPARTMENT_OTHER): Payer: BC Managed Care – PPO | Admitting: Lab

## 2011-11-27 ENCOUNTER — Telehealth: Payer: Self-pay | Admitting: Oncology

## 2011-11-27 ENCOUNTER — Ambulatory Visit (HOSPITAL_BASED_OUTPATIENT_CLINIC_OR_DEPARTMENT_OTHER): Payer: BC Managed Care – PPO | Admitting: Nurse Practitioner

## 2011-11-27 ENCOUNTER — Other Ambulatory Visit (HOSPITAL_BASED_OUTPATIENT_CLINIC_OR_DEPARTMENT_OTHER): Payer: BC Managed Care – PPO | Admitting: Lab

## 2011-11-27 VITALS — BP 137/86 | HR 68 | Temp 97.9°F | Resp 20 | Ht 67.0 in | Wt 191.6 lb

## 2011-11-27 DIAGNOSIS — Z9884 Bariatric surgery status: Secondary | ICD-10-CM

## 2011-11-27 DIAGNOSIS — K909 Intestinal malabsorption, unspecified: Secondary | ICD-10-CM

## 2011-11-27 DIAGNOSIS — D508 Other iron deficiency anemias: Secondary | ICD-10-CM

## 2011-11-27 DIAGNOSIS — K9089 Other intestinal malabsorption: Secondary | ICD-10-CM

## 2011-11-27 LAB — COMPREHENSIVE METABOLIC PANEL (CC13)
AST: 28 U/L (ref 5–34)
Albumin: 3.3 g/dL — ABNORMAL LOW (ref 3.5–5.0)
BUN: 14 mg/dL (ref 7.0–26.0)
CO2: 27 mEq/L (ref 22–29)
Calcium: 8.5 mg/dL (ref 8.4–10.4)
Chloride: 109 mEq/L — ABNORMAL HIGH (ref 98–107)
Glucose: 83 mg/dl (ref 70–99)
Potassium: 3.8 mEq/L (ref 3.5–5.1)

## 2011-11-27 LAB — IRON AND TIBC
%SAT: 13 % — ABNORMAL LOW (ref 20–55)
Iron: 47 ug/dL (ref 42–145)
UIBC: 329 ug/dL (ref 125–400)

## 2011-11-27 LAB — CBC & DIFF AND RETIC
Basophils Absolute: 0 10*3/uL (ref 0.0–0.1)
Eosinophils Absolute: 0.2 10*3/uL (ref 0.0–0.5)
HGB: 11.4 g/dL — ABNORMAL LOW (ref 11.6–15.9)
LYMPH%: 21.1 % (ref 14.0–49.7)
MCV: 84.3 fL (ref 79.5–101.0)
MONO%: 5.4 % (ref 0.0–14.0)
NEUT#: 5 10*3/uL (ref 1.5–6.5)
NEUT%: 71.3 % (ref 38.4–76.8)
Platelets: 205 10*3/uL (ref 145–400)

## 2011-11-27 NOTE — Telephone Encounter (Signed)
gv and printed pt appt schedule for March 2014 °

## 2011-11-27 NOTE — Progress Notes (Signed)
OFFICE PROGRESS NOTE  Interval history:  Katie Salas was last seen at the [facility] in January 2011. She failed to keep a followup visit. She recently contact the office to reschedule.  To review, she is a 46 year old woman with iron malabsorption secondary to previous gastric bypass surgery. She received an IV iron infusion on 09/13/2006 and again on 10/30/2008. At the time of the IV iron infusion on 10/30/2008 her ferritin level was 18 and hemoglobin 10.7.  CBC done 11/01/2009 showed a hemoglobin of 11.6, MCV 89.1, white count 5.9, absolute count 3.8 and platelet count 186,000; ferritin 135, folate 7.4, B12 418.  Katie Salas is concerned that her "iron is low". She is not sleeping well and periodically feels "dizzy". Symptoms are similar to when she has required IV iron in the past. She denies shortness of breath. No chest pain. She denies bleeding. No fevers or sweats. She has a good appetite.   Objective: Blood pressure 137/86, pulse 68, temperature 97.9 F (36.6 C), temperature source Oral, resp. rate 20, height 5\' 7"  (1.702 m), weight 191 lb 9.6 oz (86.909 kg).  Oropharynx is without thrush or ulceration. No palpable cervical, supraclavicular or axillary lymph nodes. Lungs are clear. No wheezes or rales. Regular cardiac rhythm. Abdomen is soft and nontender. No organomegaly. Extremities without edema. Calves are soft and nontender.  Lab Results: Lab Results  Component Value Date   WBC 5.9 11/01/2009   HGB 12.6 06/03/2010   HCT 37.0 06/03/2010   MCV 89.1 11/01/2009   PLT 186 11/01/2009    Chemistry:    Chemistry      Component Value Date/Time   NA 141 06/03/2010 1936   K 3.9 06/03/2010 1936   CL 106 06/03/2010 1936   CO2 26 11/27/2007 0604   BUN 11 06/03/2010 1936   CREATININE 0.60 06/03/2010 1936      Component Value Date/Time   CALCIUM 7.9* 11/27/2007 0604   ALKPHOS 88 11/27/2007 0604   AST 21 11/27/2007 0604   ALT 20 11/27/2007 0604   BILITOT 0.9 11/27/2007 0604        Studies/Results: No results found.  Medications: I have reviewed the patient's current medications.  Assessment/Plan:  1. Iron malabsorption anemia secondary to previous gastric bypass surgery. 2. Status post resection of thymoma. 3. Chronic degenerative arthritis of the spine status post neck surgery.  Disposition-Katie Salas will return to the lab today for a CBC, iron studies, B12 and chemistry panel. We will schedule her for IV iron if indicated. She will return for labs and a followup visit in 4 months.  Plan reviewed with Dr. Cyndie Chime.  Lonna Cobb ANP/GNP-BC

## 2011-11-30 ENCOUNTER — Telehealth: Payer: Self-pay | Admitting: Nurse Practitioner

## 2011-11-30 ENCOUNTER — Other Ambulatory Visit: Payer: Self-pay | Admitting: Nurse Practitioner

## 2011-11-30 NOTE — Telephone Encounter (Signed)
I left a message on Ms. Rivere's voicemail regarding the ferritin of 7 on 11/27/2011 and Dr. Patsy Lager recommendation for IV iron. An electronic POF was submitted to schedule the IV iron.

## 2011-12-01 ENCOUNTER — Telehealth: Payer: Self-pay | Admitting: *Deleted

## 2011-12-01 NOTE — Telephone Encounter (Signed)
Sent email to [employer] and [facility] to set up patient for IV IRON within one to two weeks

## 2011-12-12 ENCOUNTER — Other Ambulatory Visit: Payer: Self-pay | Admitting: Oncology

## 2011-12-12 DIAGNOSIS — D509 Iron deficiency anemia, unspecified: Secondary | ICD-10-CM

## 2011-12-13 ENCOUNTER — Ambulatory Visit (HOSPITAL_BASED_OUTPATIENT_CLINIC_OR_DEPARTMENT_OTHER): Payer: BC Managed Care – PPO

## 2011-12-13 VITALS — BP 120/66 | HR 75 | Temp 98.3°F

## 2011-12-13 DIAGNOSIS — D509 Iron deficiency anemia, unspecified: Secondary | ICD-10-CM

## 2011-12-13 DIAGNOSIS — L299 Pruritus, unspecified: Secondary | ICD-10-CM

## 2011-12-13 MED ORDER — DIPHENHYDRAMINE HCL 50 MG/ML IJ SOLN
25.0000 mg | Freq: Once | INTRAMUSCULAR | Status: AC
  Administered 2011-12-13: 50 mg via INTRAVENOUS

## 2011-12-13 MED ORDER — SODIUM CHLORIDE 0.9 % IV SOLN
Freq: Once | INTRAVENOUS | Status: AC
  Administered 2011-12-13: 10:00:00 via INTRAVENOUS

## 2011-12-13 MED ORDER — FERUMOXYTOL INJECTION 510 MG/17 ML
1020.0000 mg | Freq: Once | INTRAVENOUS | Status: AC
  Administered 2011-12-13: 1020 mg via INTRAVENOUS
  Filled 2011-12-13: qty 34

## 2011-12-13 NOTE — Progress Notes (Signed)
1120 Patient is 20 minutes post feraheme infusion. Patient is complaining of itching on her arms and legs. Vital signs obtained. Normal saline wide open. Dr. Cyndie Chime notified. Order for 25 mg Benadryl IV and monitor patient for 30 minutes given and carried out.  1153  Vital signs stable. Patient states itching has resolved. Patient's husband is going to drive her home as she is "sleepy" from the benadryl.

## 2011-12-13 NOTE — Patient Instructions (Signed)
Ferumoxytol injection What is this medicine? FERUMOXYTOL is an iron complex. Iron is used to make healthy red blood cells, which carry oxygen and nutrients throughout the body. This medicine is used to treat iron deficiency anemia in people with chronic kidney disease. This medicine may be used for other purposes; ask your health care provider or pharmacist if you have questions. What should I tell my health care provider before I take this medicine? They need to know if you have any of these conditions: -anemia not caused by low iron levels -high levels of iron in the blood -magnetic resonance imaging (MRI) test scheduled -an unusual or allergic reaction to iron, other medicines, foods, dyes, or preservatives -pregnant or trying to get pregnant -breast-feeding How should I use this medicine? This medicine is for infusion into a vein. It is given by a health care professional in a hospital or clinic setting. Talk to your pediatrician regarding the use of this medicine in children. Special care may be needed. Overdosage: If you think you've taken too much of this medicine contact a poison control center or emergency room at once. Overdosage: If you think you have taken too much of this medicine contact a poison control center or emergency room at once. NOTE: This medicine is only for you. Do not share this medicine with others. What if I miss a dose? It is important not to miss your dose. Call your doctor or health care professional if you are unable to keep an appointment. What may interact with this medicine? This medicine may interact with the following medications: -other iron products This list may not describe all possible interactions. Give your health care provider a list of all the medicines, herbs, non-prescription drugs, or dietary supplements you use. Also tell them if you smoke, drink alcohol, or use illegal drugs. Some items may interact with your medicine. What should I watch  for while using this medicine? Visit your doctor or healthcare professional regularly. Tell your doctor or healthcare professional if your symptoms do not start to get better or if they get worse. You may need blood work done while you are taking this medicine. You may need to follow a special diet. Talk to your doctor. Foods that contain iron include: whole grains/cereals, dried fruits, beans, or peas, leafy green vegetables, and organ meats (liver, kidney). What side effects may I notice from receiving this medicine? Side effects that you should report to your doctor or health care professional as soon as possible: -allergic reactions like skin rash, itching or hives, swelling of the face, lips, or tongue -breathing problems -changes in blood pressure -feeling faint or lightheaded, falls -fever or chills -flushing, sweating, or hot feelings -swelling of the ankles or feet Side effects that usually do not require medical attention (Report these to your doctor or health care professional if they continue or are bothersome.): -diarrhea -headache -nausea, vomiting -stomach pain This list may not describe all possible side effects. Call your doctor for medical advice about side effects. You may report side effects to FDA at 1-800-FDA-1088. Where should I keep my medicine? This drug is given in a hospital or clinic and will not be stored at home. NOTE: This sheet is a summary. It may not cover all possible information. If you have questions about this medicine, talk to your doctor, pharmacist, or health care provider.  2012, Elsevier/Gold Standard. (10/02/2007 9:48:25 PM) 

## 2012-02-07 ENCOUNTER — Encounter (INDEPENDENT_AMBULATORY_CARE_PROVIDER_SITE_OTHER): Payer: BC Managed Care – PPO | Admitting: Ophthalmology

## 2012-02-07 DIAGNOSIS — H2510 Age-related nuclear cataract, unspecified eye: Secondary | ICD-10-CM

## 2012-02-07 DIAGNOSIS — H43819 Vitreous degeneration, unspecified eye: Secondary | ICD-10-CM

## 2012-02-07 DIAGNOSIS — B399 Histoplasmosis, unspecified: Secondary | ICD-10-CM

## 2012-03-26 ENCOUNTER — Other Ambulatory Visit (HOSPITAL_BASED_OUTPATIENT_CLINIC_OR_DEPARTMENT_OTHER): Payer: BC Managed Care – PPO | Admitting: Lab

## 2012-03-26 ENCOUNTER — Ambulatory Visit (HOSPITAL_BASED_OUTPATIENT_CLINIC_OR_DEPARTMENT_OTHER): Payer: BC Managed Care – PPO | Admitting: Oncology

## 2012-03-26 VITALS — BP 120/77 | HR 68 | Temp 98.4°F | Resp 18 | Ht 67.0 in | Wt 187.9 lb

## 2012-03-26 DIAGNOSIS — Z9884 Bariatric surgery status: Secondary | ICD-10-CM

## 2012-03-26 DIAGNOSIS — D509 Iron deficiency anemia, unspecified: Secondary | ICD-10-CM

## 2012-03-26 DIAGNOSIS — M47812 Spondylosis without myelopathy or radiculopathy, cervical region: Secondary | ICD-10-CM

## 2012-03-26 DIAGNOSIS — K909 Intestinal malabsorption, unspecified: Secondary | ICD-10-CM

## 2012-03-26 DIAGNOSIS — K912 Postsurgical malabsorption, not elsewhere classified: Secondary | ICD-10-CM

## 2012-03-26 LAB — CBC WITH DIFFERENTIAL/PLATELET
Basophils Absolute: 0 10*3/uL (ref 0.0–0.1)
EOS%: 3.5 % (ref 0.0–7.0)
HCT: 37 % (ref 34.8–46.6)
HGB: 12.4 g/dL (ref 11.6–15.9)
MCH: 29 pg (ref 25.1–34.0)
MONO#: 0.4 10*3/uL (ref 0.1–0.9)
NEUT%: 60.7 % (ref 38.4–76.8)
lymph#: 1.3 10*3/uL (ref 0.9–3.3)

## 2012-03-26 NOTE — Patient Instructions (Signed)
Lab check every 6 months next 10/01/12

## 2012-03-27 ENCOUNTER — Encounter: Payer: Self-pay | Admitting: Oncology

## 2012-03-27 DIAGNOSIS — Z9884 Bariatric surgery status: Secondary | ICD-10-CM

## 2012-03-27 DIAGNOSIS — D4989 Neoplasm of unspecified behavior of other specified sites: Secondary | ICD-10-CM

## 2012-03-27 DIAGNOSIS — M47812 Spondylosis without myelopathy or radiculopathy, cervical region: Secondary | ICD-10-CM | POA: Insufficient documentation

## 2012-03-27 HISTORY — DX: Neoplasm of unspecified behavior of other specified sites: D49.89

## 2012-03-27 HISTORY — DX: Bariatric surgery status: Z98.84

## 2012-03-27 NOTE — Progress Notes (Signed)
Hematology and Oncology Follow Up Visit  Katie Salas [id number] October 17, 1965 47 y.o. 03/27/2012 10:00 AM   Principle Diagnosis: Encounter Diagnosis  Name Primary?  Marland Kitchen Anemia, iron deficiency Yes     Interim History:  Follow up visit for  this 47 year old woman who has iron absorption anemia due to previous gastric bypass surgery. She is asymptomatic at this time. She had missed an appointment and lab analysis last year. When she did get in to have labs checked, ferritin had fallen down to 7 recorded on 11/27/2011. Hemoglobin 11.4 at that time compared with most recent prior by you in our office of 12.6 on 06/03/2010. She was given a dose of IV iron here on 12/13/2011. Reviewing her flow sheet, it appears that she went 3 years in between iron doses.  She's not having any abdominal pain or change in bowel habits. No hematochezia or melena. No other interim medical problems.    Medications: reviewed  Allergies:  Allergies  Allergen Reactions  . Darvocet (Propoxyphene-Acetaminophen)   . Sulfa Antibiotics     Review of Systems: Constitutional:   No constitutional symptoms  Respiratory:No cough or dyspnea  Cardiovascular:  No chest pain or palpitations  Gastrointestinal:See above  Genito-Urinary: Not questioned  Musculoskeletal:No bone or joint pain  Neurologic:No headache or change in vision  Skin:No rash  Remaining ROS negative.  Physical Exam: Blood pressure 120/77, pulse 68, temperature 98.4 F (36.9 C), temperature source Oral, resp. rate 18, height 5\' 7"  (1.702 m), weight 187 lb 14.4 oz (85.231 kg). Wt Readings from Last 3 Encounters:  03/26/12 187 lb 14.4 oz (85.231 kg)  11/27/11 191 lb 9.6 oz (86.909 kg)     General appearance: Well-nourished Caucasian woman  HENNT: Pharynx no erythema or exudate  Lymph nodes: No adenopathy  Breasts: Lungs:Clear to auscultation resonant to percussion  Heart:Regular rhythm no murmur  Abdomen:Soft, nontender, no mass, no  organomegaly  Extremities:No edema, no calf tenderness  Vascular:No cyanosis  Neurologic:Grossly normal  Skin:No rash or ecchymosis  Lab Results: Lab Results  Component Value Date   WBC 4.6 03/26/2012   HGB 12.4 03/26/2012   HCT 37.0 03/26/2012   MCV 86.7 03/26/2012   PLT 150 03/26/2012     Chemistry      Component Value Date/Time   NA 140 11/27/2011 1515   NA 141 06/03/2010 1936   K 3.8 11/27/2011 1515   K 3.9 06/03/2010 1936   CL 109* 11/27/2011 1515   CL 106 06/03/2010 1936   CO2 27 11/27/2011 1515   CO2 26 11/27/2007 0604   BUN 14.0 11/27/2011 1515   BUN 11 06/03/2010 1936   CREATININE 0.6 11/27/2011 1515   CREATININE 0.60 06/03/2010 1936      Component Value Date/Time   CALCIUM 8.5 11/27/2011 1515   CALCIUM 7.9* 11/27/2007 0604   ALKPHOS 160* 11/27/2011 1515   ALKPHOS 88 11/27/2007 0604   AST 28 11/27/2011 1515   AST 21 11/27/2007 0604   ALT 25 11/27/2011 1515   ALT 20 11/27/2007 0604   BILITOT 0.81 11/27/2011 1515   BILITOT 0.9 11/27/2007 0604       Impression and Plan: #1. Iron malabsorption due to previous gastric bypass surgery I'm going to put her on a every six-month lab schedule. She will receive additional doses of parenteral iron as needed.  #2. History of thymoma treated with surgery alone  #3. Degenerative arthritis status post cervical spine surgery   CC:Marland Kitchen    Levert Feinstein, MD 3/5/201410:00 AM

## 2012-10-01 ENCOUNTER — Other Ambulatory Visit (HOSPITAL_BASED_OUTPATIENT_CLINIC_OR_DEPARTMENT_OTHER): Payer: BC Managed Care – PPO

## 2012-10-01 DIAGNOSIS — D509 Iron deficiency anemia, unspecified: Secondary | ICD-10-CM

## 2012-10-01 LAB — CBC WITH DIFFERENTIAL/PLATELET
Basophils Absolute: 0 10*3/uL (ref 0.0–0.1)
Eosinophils Absolute: 0.1 10*3/uL (ref 0.0–0.5)
HCT: 37.2 % (ref 34.8–46.6)
HGB: 12 g/dL (ref 11.6–15.9)
LYMPH%: 26.5 % (ref 14.0–49.7)
MCV: 88.2 fL (ref 79.5–101.0)
MONO%: 7.2 % (ref 0.0–14.0)
NEUT#: 3.1 10*3/uL (ref 1.5–6.5)
NEUT%: 62.9 % (ref 38.4–76.8)
Platelets: 200 10*3/uL (ref 145–400)

## 2012-10-22 ENCOUNTER — Telehealth: Payer: Self-pay | Admitting: *Deleted

## 2012-10-22 NOTE — Telephone Encounter (Signed)
Message copied by Sabino Snipes on Tue Oct 22, 2012 10:25 AM ------      Message from: Levert Feinstein      Created: Thu Oct 10, 2012 12:30 PM       Call pt - storage iron drifting down but still in adequate range - no need for IV iron right now ------

## 2012-10-22 NOTE — Telephone Encounter (Signed)
Message left on pt's identified vm stating that iron storage drifting down but still in adequate range & no need for IV iron right now per Dr Cyndie Chime & asked to call back to let us know she received message.

## 2012-11-06 ENCOUNTER — Ambulatory Visit (INDEPENDENT_AMBULATORY_CARE_PROVIDER_SITE_OTHER): Payer: BC Managed Care – PPO | Admitting: Ophthalmology

## 2013-03-28 ENCOUNTER — Telehealth: Payer: Self-pay | Admitting: Oncology

## 2013-03-28 NOTE — Telephone Encounter (Signed)
pt cancelled will call later to r/s , aware that MD is retiring

## 2013-04-01 ENCOUNTER — Ambulatory Visit: Payer: BC Managed Care – PPO | Admitting: Oncology

## 2013-04-01 ENCOUNTER — Other Ambulatory Visit: Payer: BC Managed Care – PPO

## 2013-10-21 ENCOUNTER — Ambulatory Visit: Payer: Self-pay | Admitting: Specialist

## 2013-11-17 ENCOUNTER — Encounter (HOSPITAL_COMMUNITY): Payer: Self-pay | Admitting: Pharmacist

## 2013-11-17 NOTE — H&P (Signed)
MYONNA CHISOM  DICTATION # [id number] CSN# [id number]   Margarette Asal, MD 11/17/2013 9:39 AM

## 2013-11-17 NOTE — H&P (Signed)
Katie Salas, ROORDA              ACCOUNT NO.:  [id number]  MEDICAL RECORD NO.:  [id number]  LOCATION:                                 FACILITY:  PHYSICIAN:  Ralene Bathe. Matthew Saras, M.D.    DATE OF BIRTH:  DATE OF ADMISSION:  11/19/2013 DATE OF DISCHARGE:                             HISTORY & PHYSICAL   CHIEF COMPLAINT:  Pelvic pain and menorrhagia.  HPI:  A 48 year old, G1, P1, prior Essure tubal.  She was evaluated recently for continued problems with dysmenorrhea and menorrhagia.  Her last ultrasound in the office in 2007 did demonstrate leiomyoma, followup SHG dated September 15 in our office showed multiple fibroids 4.2, 2.0 and a number that were smaller.  No adnexal masses noted and a small endometrial polyp noted also.  We discussed a number of options with her ranging from D and C, hysteroscopy with ablation to more definitive hysterectomy.  She has a strong preference to proceed with definitive treatment in the form of TAH, bilateral salpingectomy.  This procedure including specific risks related to bleeding, infection, transfusion, wound infection, phlebitis were reviewed with her which she understands and accepts.  Other risks related to her postoperative recovery time, the possible need for supracervical hysterectomy discussed also.  Last Pap, February 14 was negative.  PAST MEDICAL HISTORY:  ALLERGIES:  SULFA.  CURRENT MEDICATIONS:  Allergy medicines p.r.n.  SURGICAL HISTORY: 1. Cesarean section in 2002. 2. Gastric bypass surgery in 2003 with cholecystectomy. 3. She has also had panniculectomy that was performed by Dr.     Towanda Malkin, reviewed by phone conversation with Dr. Towanda Malkin.  He     did not use mesh at that time, did not do anything to the fascia,     just did panniculectomy.  REVIEW OF SYSTEMS:  Significant for headache, anemia, phlebitis.  FAMILY HISTORY:  Significant for heart disease, hernia, IBS, gallbladder disease, UTI, osteoporosis,  diverticulosis, arthritis, diabetes, hypertension, and lung cancer.  SOCIAL HISTORY:  She is married.  Denies alcohol, tobacco, or drug use.  PHYSICAL EXAMINATION:  VITAL SIGNS:  Temp 98.2, blood pressure 130/78. HEENT:  Unremarkable. NECK:  Supple without masses. LUNGS:  Clear. CARDIOVASCULAR:  Regular rate and rhythm without murmurs, rubs, gallops. BREASTS:  Without masses. ABDOMEN:  Soft, flat, nontender.  Well-healed C-section/panniculectomy scar.  Vulva, vagina, cervix normal.  Uterus was 10-week size, mid position.  Adnexa negative.  Poor descent. EXTREMITIES:  Unremarkable. NEUROLOGIC:  Unremarkable.  IMPRESSION:  Symptomatic leiomyoma with menorrhagia and dysmenorrhea.  PLAN:  TAH, possible supracervical hysterectomy with bilateral salpingectomy.  Procedure and risks reviewed as above.     Katie Salas M. Matthew Saras, M.D.     [facility]/MEDQ  D:  11/17/2013  T:  11/17/2013  Job:  [id number]

## 2013-11-18 ENCOUNTER — Encounter (HOSPITAL_COMMUNITY)
Admission: RE | Admit: 2013-11-18 | Discharge: 2013-11-18 | Disposition: A | Discharge status: Home / Self Care | Payer: Managed Care, Other (non HMO) | Source: Ambulatory Visit | Attending: Obstetrics and Gynecology | Admitting: Obstetrics and Gynecology

## 2013-11-18 ENCOUNTER — Encounter (HOSPITAL_COMMUNITY): Payer: Self-pay

## 2013-11-18 HISTORY — DX: Essential (primary) hypertension: I10

## 2013-11-18 HISTORY — DX: Anemia, unspecified: D64.9

## 2013-11-18 HISTORY — DX: Gastro-esophageal reflux disease without esophagitis: K21.9

## 2013-11-18 HISTORY — DX: Nausea with vomiting, unspecified: R11.2

## 2013-11-18 HISTORY — DX: Other specified postprocedural states: Z98.890

## 2013-11-18 LAB — CBC
HCT: 36.1 % (ref 36.0–46.0)
Hemoglobin: 11.5 g/dL — ABNORMAL LOW (ref 12.0–15.0)
MCH: 26.6 pg (ref 26.0–34.0)
MCHC: 31.9 g/dL (ref 30.0–36.0)
MCV: 83.4 fL (ref 78.0–100.0)
PLATELETS: 247 10*3/uL (ref 150–400)
RBC: 4.33 MIL/uL (ref 3.87–5.11)
RDW: 13.2 % (ref 11.5–15.5)
WBC: 6.5 10*3/uL (ref 4.0–10.5)

## 2013-11-18 MED ORDER — DEXTROSE 5 % IV SOLN
2.0000 g | INTRAVENOUS | Status: AC
  Administered 2013-11-19: 2 g via INTRAVENOUS
  Filled 2013-11-18: qty 2

## 2013-11-18 NOTE — Patient Instructions (Signed)
Your procedure is scheduled on:11/19/13  Enter through the Main Entrance at :[facility] desk phone and dial [phone] and inform us of your arrival.  Please call [phone] if you have any problems the morning of surgery.  Remember: Do not eat food or drink liquids, including water, after midnight:tonight  You may brush your teeth the morning of surgery.   DO NOT wear jewelry, eye make-up, lipstick,body lotion, or dark fingernail polish.  (Polished toes are ok) You may wear deodorant.  If you are to be admitted after surgery, leave suitcase in car until your room has been assigned. Patients discharged on the day of surgery will not be allowed to drive home. Wear loose fitting, comfortable clothes for your ride home.

## 2013-11-19 ENCOUNTER — Encounter (HOSPITAL_COMMUNITY): Payer: Managed Care, Other (non HMO) | Admitting: Anesthesiology

## 2013-11-19 ENCOUNTER — Inpatient Hospital Stay (HOSPITAL_COMMUNITY): Payer: Managed Care, Other (non HMO) | Admitting: Anesthesiology

## 2013-11-19 ENCOUNTER — Inpatient Hospital Stay (HOSPITAL_COMMUNITY)
Admission: RE | Admit: 2013-11-19 | Discharge: 2013-11-21 | DRG: 743 | Disposition: A | Discharge status: Home / Self Care | Payer: Managed Care, Other (non HMO) | Source: Ambulatory Visit | Attending: Obstetrics and Gynecology | Admitting: Obstetrics and Gynecology

## 2013-11-19 ENCOUNTER — Encounter (HOSPITAL_COMMUNITY)
Admission: RE | Disposition: A | Discharge status: Home / Self Care | Payer: Self-pay | Source: Ambulatory Visit | Attending: Obstetrics and Gynecology

## 2013-11-19 ENCOUNTER — Encounter (HOSPITAL_COMMUNITY): Payer: Self-pay | Admitting: Certified Registered Nurse Anesthetist

## 2013-11-19 DIAGNOSIS — N840 Polyp of corpus uteri: Secondary | ICD-10-CM | POA: Diagnosis present

## 2013-11-19 DIAGNOSIS — Z9049 Acquired absence of other specified parts of digestive tract: Secondary | ICD-10-CM | POA: Diagnosis present

## 2013-11-19 DIAGNOSIS — D259 Leiomyoma of uterus, unspecified: Principal | ICD-10-CM | POA: Diagnosis present

## 2013-11-19 DIAGNOSIS — N920 Excessive and frequent menstruation with regular cycle: Secondary | ICD-10-CM | POA: Diagnosis present

## 2013-11-19 DIAGNOSIS — Z882 Allergy status to sulfonamides status: Secondary | ICD-10-CM | POA: Diagnosis not present

## 2013-11-19 DIAGNOSIS — N946 Dysmenorrhea, unspecified: Secondary | ICD-10-CM | POA: Diagnosis present

## 2013-11-19 DIAGNOSIS — D649 Anemia, unspecified: Secondary | ICD-10-CM | POA: Diagnosis present

## 2013-11-19 DIAGNOSIS — R51 Headache: Secondary | ICD-10-CM | POA: Diagnosis present

## 2013-11-19 DIAGNOSIS — N736 Female pelvic peritoneal adhesions (postinfective): Secondary | ICD-10-CM | POA: Diagnosis present

## 2013-11-19 DIAGNOSIS — Z9884 Bariatric surgery status: Secondary | ICD-10-CM | POA: Diagnosis not present

## 2013-11-19 DIAGNOSIS — I809 Phlebitis and thrombophlebitis of unspecified site: Secondary | ICD-10-CM | POA: Diagnosis present

## 2013-11-19 DIAGNOSIS — Z9889 Other specified postprocedural states: Secondary | ICD-10-CM | POA: Diagnosis not present

## 2013-11-19 DIAGNOSIS — D219 Benign neoplasm of connective and other soft tissue, unspecified: Secondary | ICD-10-CM | POA: Diagnosis present

## 2013-11-19 HISTORY — PX: ABDOMINAL HYSTERECTOMY: SHX81

## 2013-11-19 LAB — PREGNANCY, URINE: Preg Test, Ur: NEGATIVE

## 2013-11-19 SURGERY — HYSTERECTOMY, ABDOMINAL
Anesthesia: General | Site: Abdomen

## 2013-11-19 MED ORDER — MIDAZOLAM HCL 2 MG/2ML IJ SOLN
INTRAMUSCULAR | Status: AC
  Filled 2013-11-19: qty 2

## 2013-11-19 MED ORDER — KETOROLAC TROMETHAMINE 30 MG/ML IJ SOLN
INTRAMUSCULAR | Status: DC | PRN
  Administered 2013-11-19: 30 mg via INTRAVENOUS

## 2013-11-19 MED ORDER — SCOPOLAMINE 1 MG/3DAYS TD PT72
1.0000 | MEDICATED_PATCH | Freq: Once | TRANSDERMAL | Status: DC
  Administered 2013-11-19: 1.5 mg via TRANSDERMAL

## 2013-11-19 MED ORDER — HYDROMORPHONE HCL 1 MG/ML IJ SOLN
0.2500 mg | INTRAMUSCULAR | Status: DC | PRN
  Administered 2013-11-19 (×2): 0.25 mg via INTRAVENOUS

## 2013-11-19 MED ORDER — PROMETHAZINE HCL 25 MG/ML IJ SOLN
6.2500 mg | Freq: Once | INTRAMUSCULAR | Status: AC
  Administered 2013-11-19: 6.25 mg via INTRAVENOUS

## 2013-11-19 MED ORDER — ONDANSETRON HCL 4 MG/2ML IJ SOLN
INTRAMUSCULAR | Status: DC | PRN
  Administered 2013-11-19: 4 mg via INTRAVENOUS

## 2013-11-19 MED ORDER — FENTANYL CITRATE 0.05 MG/ML IJ SOLN
INTRAMUSCULAR | Status: DC | PRN
  Administered 2013-11-19 (×2): 100 ug via INTRAVENOUS
  Administered 2013-11-19: 50 ug via INTRAVENOUS

## 2013-11-19 MED ORDER — ROCURONIUM BROMIDE 100 MG/10ML IV SOLN
INTRAVENOUS | Status: DC | PRN
  Administered 2013-11-19: 10 mg via INTRAVENOUS
  Administered 2013-11-19: 60 mg via INTRAVENOUS

## 2013-11-19 MED ORDER — KETOROLAC TROMETHAMINE 30 MG/ML IJ SOLN: 30.0000 mg | Freq: Four times a day (QID) | INTRAMUSCULAR | Status: DC

## 2013-11-19 MED ORDER — PROMETHAZINE HCL 25 MG/ML IJ SOLN
INTRAMUSCULAR | Status: AC
  Filled 2013-11-19: qty 1

## 2013-11-19 MED ORDER — GLYCOPYRROLATE 0.2 MG/ML IJ SOLN
INTRAMUSCULAR | Status: DC | PRN
  Administered 2013-11-19: .8 mg via INTRAVENOUS

## 2013-11-19 MED ORDER — MENTHOL 3 MG MT LOZG: 1.0000 | LOZENGE | OROMUCOSAL | Status: DC | PRN

## 2013-11-19 MED ORDER — ADULT MULTIVITAMIN W/MINERALS CH
1.0000 | ORAL_TABLET | Freq: Every day | ORAL | Status: DC
  Administered 2013-11-20: 1 via ORAL
  Filled 2013-11-19 (×2): qty 1

## 2013-11-19 MED ORDER — FENTANYL CITRATE 0.05 MG/ML IJ SOLN
INTRAMUSCULAR | Status: AC
  Filled 2013-11-19: qty 5

## 2013-11-19 MED ORDER — ONDANSETRON HCL 4 MG/2ML IJ SOLN: 4.0000 mg | Freq: Four times a day (QID) | INTRAMUSCULAR | Status: DC | PRN

## 2013-11-19 MED ORDER — OXYCODONE-ACETAMINOPHEN 5-325 MG PO TABS
1.0000 | ORAL_TABLET | ORAL | Status: DC | PRN
  Administered 2013-11-20: 1 via ORAL
  Administered 2013-11-20: 2 via ORAL
  Administered 2013-11-21 (×2): 1 via ORAL
  Filled 2013-11-19: qty 2
  Filled 2013-11-19 (×3): qty 1

## 2013-11-19 MED ORDER — BUTORPHANOL TARTRATE 1 MG/ML IJ SOLN: 1.0000 mg | INTRAMUSCULAR | Status: DC | PRN

## 2013-11-19 MED ORDER — SODIUM CHLORIDE 0.9 % IJ SOLN
INTRAMUSCULAR | Status: DC | PRN
  Administered 2013-11-19: 20 mL

## 2013-11-19 MED ORDER — DIPHENHYDRAMINE HCL 50 MG/ML IJ SOLN
INTRAMUSCULAR | Status: DC | PRN
  Administered 2013-11-19: 25 mg via INTRAVENOUS

## 2013-11-19 MED ORDER — SCOPOLAMINE 1 MG/3DAYS TD PT72
MEDICATED_PATCH | TRANSDERMAL | Status: AC
  Administered 2013-11-19: 1.5 mg via TRANSDERMAL
  Filled 2013-11-19: qty 1

## 2013-11-19 MED ORDER — LORATADINE 10 MG PO TABS
10.0000 mg | ORAL_TABLET | Freq: Every day | ORAL | Status: DC | PRN
  Filled 2013-11-19: qty 1

## 2013-11-19 MED ORDER — DIPHENHYDRAMINE HCL 50 MG/ML IJ SOLN
INTRAMUSCULAR | Status: AC
  Filled 2013-11-19: qty 1

## 2013-11-19 MED ORDER — DEXAMETHASONE SODIUM PHOSPHATE 4 MG/ML IJ SOLN
INTRAMUSCULAR | Status: AC
  Filled 2013-11-19: qty 1

## 2013-11-19 MED ORDER — NALOXONE HCL 0.4 MG/ML IJ SOLN
0.4000 mg | INTRAMUSCULAR | Status: DC | PRN
Start: 2013-11-19 — End: 2013-11-20

## 2013-11-19 MED ORDER — BUPIVACAINE LIPOSOME 1.3 % IJ SUSP
20.0000 mL | Freq: Once | INTRAMUSCULAR | Status: AC
  Administered 2013-11-19: 20 mL
  Filled 2013-11-19: qty 20

## 2013-11-19 MED ORDER — NEOSTIGMINE METHYLSULFATE 10 MG/10ML IV SOLN
INTRAVENOUS | Status: DC | PRN
  Administered 2013-11-19: 5 mg via INTRAVENOUS

## 2013-11-19 MED ORDER — MORPHINE SULFATE (PF) 1 MG/ML IV SOLN
INTRAVENOUS | Status: DC
  Administered 2013-11-19: 3 mg via INTRAVENOUS
  Administered 2013-11-19: 4 mg via INTRAVENOUS
  Administered 2013-11-19: 12:00:00 via INTRAVENOUS
  Administered 2013-11-19: 5 mg via INTRAVENOUS
  Administered 2013-11-20: 1 mg via INTRAVENOUS
  Filled 2013-11-19: qty 25

## 2013-11-19 MED ORDER — VITAMIN C 500 MG PO TABS
1000.0000 mg | ORAL_TABLET | Freq: Every day | ORAL | Status: DC
  Filled 2013-11-19 (×2): qty 2

## 2013-11-19 MED ORDER — DIPHENHYDRAMINE HCL 50 MG/ML IJ SOLN: 12.5000 mg | Freq: Four times a day (QID) | INTRAMUSCULAR | Status: DC | PRN

## 2013-11-19 MED ORDER — KETOROLAC TROMETHAMINE 30 MG/ML IJ SOLN
30.0000 mg | Freq: Four times a day (QID) | INTRAMUSCULAR | Status: DC
  Administered 2013-11-19 – 2013-11-20 (×3): 30 mg via INTRAVENOUS
  Filled 2013-11-19 (×3): qty 1

## 2013-11-19 MED ORDER — PROPOFOL 10 MG/ML IV EMUL
INTRAVENOUS | Status: AC
  Filled 2013-11-19: qty 20

## 2013-11-19 MED ORDER — SODIUM CHLORIDE 0.9 % IJ SOLN
INTRAMUSCULAR | Status: AC
  Filled 2013-11-19: qty 20

## 2013-11-19 MED ORDER — KETOROLAC TROMETHAMINE 30 MG/ML IJ SOLN: 30.0000 mg | Freq: Once | INTRAMUSCULAR | Status: DC

## 2013-11-19 MED ORDER — 0.9 % SODIUM CHLORIDE (POUR BTL) OPTIME
TOPICAL | Status: DC | PRN
  Administered 2013-11-19: 1000 mL

## 2013-11-19 MED ORDER — IBUPROFEN 800 MG PO TABS
800.0000 mg | ORAL_TABLET | Freq: Three times a day (TID) | ORAL | Status: DC | PRN
  Administered 2013-11-20 – 2013-11-21 (×3): 800 mg via ORAL
  Filled 2013-11-19 (×3): qty 1

## 2013-11-19 MED ORDER — ONDANSETRON HCL 4 MG/2ML IJ SOLN
INTRAMUSCULAR | Status: AC
  Filled 2013-11-19: qty 2

## 2013-11-19 MED ORDER — PROPOFOL 10 MG/ML IV BOLUS
INTRAVENOUS | Status: DC | PRN
  Administered 2013-11-19: 170 mg via INTRAVENOUS

## 2013-11-19 MED ORDER — LACTATED RINGERS IV SOLN
INTRAVENOUS | Status: DC
  Administered 2013-11-19 (×2): via INTRAVENOUS

## 2013-11-19 MED ORDER — LIDOCAINE HCL (CARDIAC) 20 MG/ML IV SOLN
INTRAVENOUS | Status: DC | PRN
  Administered 2013-11-19: 80 mg via INTRAVENOUS

## 2013-11-19 MED ORDER — MIDAZOLAM HCL 2 MG/2ML IJ SOLN
INTRAMUSCULAR | Status: DC | PRN
  Administered 2013-11-19: 2 mg via INTRAVENOUS

## 2013-11-19 MED ORDER — SODIUM CHLORIDE 0.9 % IJ SOLN: 9.0000 mL | INTRAMUSCULAR | Status: DC | PRN

## 2013-11-19 MED ORDER — LIDOCAINE HCL (CARDIAC) 20 MG/ML IV SOLN
INTRAVENOUS | Status: AC
  Filled 2013-11-19: qty 5

## 2013-11-19 MED ORDER — DEXAMETHASONE SODIUM PHOSPHATE 10 MG/ML IJ SOLN
INTRAMUSCULAR | Status: DC | PRN
  Administered 2013-11-19: 4 mg via INTRAVENOUS

## 2013-11-19 MED ORDER — DEXTROSE IN LACTATED RINGERS 5 % IV SOLN
INTRAVENOUS | Status: DC
  Administered 2013-11-19 (×2): via INTRAVENOUS

## 2013-11-19 MED ORDER — ONDANSETRON HCL 4 MG PO TABS: 4.0000 mg | ORAL_TABLET | Freq: Four times a day (QID) | ORAL | Status: DC | PRN

## 2013-11-19 MED ORDER — HYDROMORPHONE HCL 1 MG/ML IJ SOLN
INTRAMUSCULAR | Status: AC
  Administered 2013-11-19: 0.25 mg via INTRAVENOUS
  Filled 2013-11-19: qty 1

## 2013-11-19 MED ORDER — KETOROLAC TROMETHAMINE 30 MG/ML IJ SOLN
INTRAMUSCULAR | Status: AC
  Filled 2013-11-19: qty 1

## 2013-11-19 MED ORDER — DIPHENHYDRAMINE HCL 12.5 MG/5ML PO ELIX: 12.5000 mg | ORAL_SOLUTION | Freq: Four times a day (QID) | ORAL | Status: DC | PRN

## 2013-11-19 MED ORDER — ROCURONIUM BROMIDE 100 MG/10ML IV SOLN
INTRAVENOUS | Status: AC
  Filled 2013-11-19: qty 1

## 2013-11-19 SURGICAL SUPPLY — 48 items
BARRIER ADHS 3X4 INTERCEED (GAUZE/BANDAGES/DRESSINGS) IMPLANT
BRR ADH 4X3 ABS CNTRL BYND (GAUZE/BANDAGES/DRESSINGS)
CANISTER SUCT 3000ML (MISCELLANEOUS) ×3 IMPLANT
CLOSURE WOUND 1/4X4 (GAUZE/BANDAGES/DRESSINGS)
CLOTH BEACON ORANGE TIMEOUT ST (SAFETY) ×3 IMPLANT
CONT PATH 16OZ SNAP LID 3702 (MISCELLANEOUS) ×3 IMPLANT
DECANTER SPIKE VIAL GLASS SM (MISCELLANEOUS) IMPLANT
DRAPE WARM FLUID 44X44 (DRAPE) ×3 IMPLANT
DRSG OPSITE POSTOP 4X10 (GAUZE/BANDAGES/DRESSINGS) ×3 IMPLANT
DURAPREP 26ML APPLICATOR (WOUND CARE) ×3 IMPLANT
ELECT LIGASURE SHORT 9 REUSE (ELECTRODE) IMPLANT
GLOVE BIO SURGEON STRL SZ7 (GLOVE) ×6 IMPLANT
GLOVE BIOGEL PI IND STRL 7.0 (GLOVE) ×2 IMPLANT
GLOVE BIOGEL PI INDICATOR 7.0 (GLOVE) ×4
GOWN STRL REUS W/TWL LRG LVL3 (GOWN DISPOSABLE) ×9 IMPLANT
NDL HYPO 18GX1.5 BLUNT FILL (NEEDLE) IMPLANT
NEEDLE HYPO 18GX1.5 BLUNT FILL (NEEDLE) IMPLANT
NEEDLE HYPO 22GX1.5 SAFETY (NEEDLE) ×3 IMPLANT
NS IRRIG 1000ML POUR BTL (IV SOLUTION) ×3 IMPLANT
PACK ABDOMINAL GYN (CUSTOM PROCEDURE TRAY) ×3 IMPLANT
PAD ABD 7.5X8 STRL (GAUZE/BANDAGES/DRESSINGS) ×2 IMPLANT
PAD OB MATERNITY 4.3X12.25 (PERSONAL CARE ITEMS) ×3 IMPLANT
PROTECTOR NERVE ULNAR (MISCELLANEOUS) ×3 IMPLANT
RTRCTR C-SECT PINK 25CM LRG (MISCELLANEOUS) ×2 IMPLANT
SPONGE GAUZE 4X4 12PLY STER LF (GAUZE/BANDAGES/DRESSINGS) ×2 IMPLANT
SPONGE LAP 18X18 X RAY DECT (DISPOSABLE) ×6 IMPLANT
STAPLER VISISTAT 35W (STAPLE) ×2 IMPLANT
STRIP CLOSURE SKIN 1/4X4 (GAUZE/BANDAGES/DRESSINGS) IMPLANT
SUT CHROMIC 3 0 SH 27 (SUTURE) IMPLANT
SUT MON AB 2-0 CT1 36 (SUTURE) ×3 IMPLANT
SUT MON AB 4-0 PS1 27 (SUTURE) ×3 IMPLANT
SUT PDS AB 0 CT1 27 (SUTURE) ×6 IMPLANT
SUT VIC AB 0 CT1 18XCR BRD8 (SUTURE) ×2 IMPLANT
SUT VIC AB 0 CT1 27 (SUTURE) ×3
SUT VIC AB 0 CT1 27XBRD ANBCTR (SUTURE) ×1 IMPLANT
SUT VIC AB 0 CT1 8-18 (SUTURE) ×6
SUT VIC AB 2-0 CT1 27 (SUTURE)
SUT VIC AB 2-0 CT1 TAPERPNT 27 (SUTURE) IMPLANT
SUT VIC AB 3-0 CT1 27 (SUTURE) ×3
SUT VIC AB 3-0 CT1 TAPERPNT 27 (SUTURE) ×1 IMPLANT
SUT VIC AB 3-0 SH 27 (SUTURE) ×3
SUT VIC AB 3-0 SH 27X BRD (SUTURE) IMPLANT
SUT VICRYL 0 TIES 12 18 (SUTURE) ×3 IMPLANT
SYR 20CC LL (SYRINGE) ×6 IMPLANT
TAPE PAPER 2X10 WHT MICROPORE (GAUZE/BANDAGES/DRESSINGS) ×2 IMPLANT
TOWEL OR 17X24 6PK STRL BLUE (TOWEL DISPOSABLE) ×6 IMPLANT
TRAY FOLEY CATH 14FR (SET/KITS/TRAYS/PACK) ×3 IMPLANT
WATER STERILE IRR 1000ML POUR (IV SOLUTION) ×3 IMPLANT

## 2013-11-19 NOTE — Anesthesia Postprocedure Evaluation (Signed)
  Anesthesia Post-op Note  Patient: Katie Salas  Procedure(s) Performed: Procedure(s): HYSTERECTOMY ABDOMINAL (N/A)  Patient Location: [facility]  Anesthesia Type:General  Level of Consciousness: awake, alert  and oriented  Airway and Oxygen Therapy: Patient Spontanous Breathing and Patient connected to nasal cannula oxygen  Post-op Pain: none  Post-op Assessment: Post-op Vital signs reviewed, Patient's Cardiovascular Status Stable, Respiratory Function Stable, No headache, No backache, No residual numbness and No residual motor weakness  Post-op Vital Signs: Reviewed and stable  Last Vitals:  Filed Vitals:   11/19/13 1746  BP: 114/59  Pulse: 68  Temp: 37.3 C  Resp: 16    Complications: No apparent anesthesia complications

## 2013-11-19 NOTE — Anesthesia Preprocedure Evaluation (Signed)

## 2013-11-19 NOTE — Progress Notes (Signed)
The patient was re-examined with no change in status 

## 2013-11-19 NOTE — Transfer of Care (Signed)
Immediate Anesthesia Transfer of Care Note  Patient: Katie Salas  Procedure(s) Performed: Procedure(s): HYSTERECTOMY ABDOMINAL (N/A)  Patient Location: PACU  Anesthesia Type:General  Level of Consciousness: awake, alert , oriented and patient cooperative  Airway & Oxygen Therapy: Patient Spontanous Breathing and Patient connected to nasal cannula oxygen  Post-op Assessment: Report given to PACU RN and Post -op Vital signs reviewed and stable  Post vital signs: Reviewed and stable  Complications: No apparent anesthesia complications

## 2013-11-19 NOTE — Op Note (Signed)
Preoperative diagnosis: Pelvic pain/leiomyoma  Postoperative diagnosis: Same  Procedure: TAH, bilateral salpingectomy, lysis of adhesions  Surgeon: Matthew Saras  Asst.: Morris  EBL: 2 50 cc  Specimens removed: Uterus, bilateral tubes, to pathology  Drains: Foley, to straight drain  Procedure and findings:  The patient was taken the operating room after an adequate level of general anesthesia was obtained with the patient supine the abdomen and vagina prepped and draped separately and Foley catheter positioned. Appropriate timeouts taken at that point. Transverse incision made 2-3 finger breaths above the symphysis just below her abdominoplasty incision. This is carried down to the fascia which was incised and extended transversely. Rectus muscles divided in the midline, peritoneum entered superiorly without incident and extended in a vertical fashion. Some omental adhesions were lysed in an avascular plane, Alexis retractor was in position and bowel Speck superiorly out of the field patient in Trendelenburg.  Pelvic findings as follows: Uterus itself was enlarged 12 week size with some irregular fibroids, bilateral tubes and ovaries were unremarkable the cul-de-sac was free and clear there was moderate scarring anteriorly from her prior C-section. Starting on the left the round ligament was coagulated and divided with the LigaSure device this was carried around anteriorly the same repeated on the opposite side the bladder flap adhesions were then lysed in an avascular plane sharp and blunt dissection the bladder was dissected below the cervical vaginal junction. On the left the utero-ovarian pedicle was clamped divided and free tie followed by suture ligature of Vicryl. Conserving that ovary and tube. The exact same repeated on the opposite side. In sequential manner the uterine basket or pedicles were skeletonized, clamped divided and suture ligated with 0 Vicryl. Similarly the cardinal ligament,  uterosacral ligament and cervical vaginal pedicles were clamped divided and suture ligated with 0 Vicryl suture the specimen was then excised. Cuff closed with a running locked 2-0 Vicryl suture. This point the pelvis was irrigated noted be hemostatic. LigaSure was then used to coagulate and divide the tubes and both were submitted as bilateral salpingectomy. Both ovaries were conserved and appeared otherwise normal. Prior to closure sponge needle is McCash reported as correct 2 peritoneum closed with a 2-0 Vicryl running suture, 2-0 Vicryl interrupted sutures used to close the rectus muscles in the midline. 0 PDS was then used to close the fascia from laterally to midline on either side subcutaneous tissue had some scarring placing and on tension this was undermined and rendered hemostatic with the Bovie this allow for better reapproximation dilutedExperel L solution was then injected into the incision and the skin was closed staples and a pressure dressing. She tolerated this well went to recovery room in good condition.  Dictated with [place].

## 2013-11-19 NOTE — Anesthesia Postprocedure Evaluation (Signed)
  Anesthesia Post-op Note  Patient: Katie Salas  Procedure(s) Performed: Procedure(s): HYSTERECTOMY ABDOMINAL (N/A) Patient is awake and responsive. Pain and nausea are reasonably well controlled. Vital signs are stable and clinically acceptable. Oxygen saturation is clinically acceptable. There are no apparent anesthetic complications at this time. Patient is ready for discharge.

## 2013-11-19 NOTE — Addendum Note (Signed)
Addendum created 11/19/13 1828 by Jonna Munro, CRNA   Modules edited: Notes Section   Notes Section:  File: [id number]

## 2013-11-20 ENCOUNTER — Encounter (HOSPITAL_COMMUNITY): Payer: Self-pay | Admitting: Obstetrics and Gynecology

## 2013-11-20 LAB — CBC
HCT: 30.7 % — ABNORMAL LOW (ref 36.0–46.0)
Hemoglobin: 9.8 g/dL — ABNORMAL LOW (ref 12.0–15.0)
MCH: 26.7 pg (ref 26.0–34.0)
MCHC: 31.9 g/dL (ref 30.0–36.0)
MCV: 83.7 fL (ref 78.0–100.0)
Platelets: 228 10*3/uL (ref 150–400)
RBC: 3.67 MIL/uL — ABNORMAL LOW (ref 3.87–5.11)
RDW: 13.2 % (ref 11.5–15.5)
WBC: 8.4 10*3/uL (ref 4.0–10.5)

## 2013-11-20 NOTE — Progress Notes (Signed)
1 Day Post-Op Procedure(s) (LRB): HYSTERECTOMY ABDOMINAL (N/A)  Subjective: Patient reports tolerating PO.    Objective: I have reviewed patient's vital signs, medications and labs. BP 97/48  Pulse 61  Temp(Src) 98.5 F (36.9 C) (Oral)  Resp 14  Ht 5\' 7"  (1.702 m)  Wt 215 lb (97.523 kg)  BMI 33.67 kg/m2  SpO2 96% CBC    Component Value Date/Time   WBC 8.4 11/20/2013 0550   WBC 5.0 10/01/2012 1044   RBC 3.67* 11/20/2013 0550   RBC 4.22 10/01/2012 1044   HGB 9.8* 11/20/2013 0550   HGB 12.0 10/01/2012 1044   HCT 30.7* 11/20/2013 0550   HCT 37.2 10/01/2012 1044   PLT 228 11/20/2013 0550   PLT 200 10/01/2012 1044   MCV 83.7 11/20/2013 0550   MCV 88.2 10/01/2012 1044   MCH 26.7 11/20/2013 0550   MCH 28.4 10/01/2012 1044   MCHC 31.9 11/20/2013 0550   MCHC 32.3 10/01/2012 1044   RDW 13.2 11/20/2013 0550   RDW 13.1 10/01/2012 1044   LYMPHSABS 1.3 10/01/2012 1044   MONOABS 0.4 10/01/2012 1044   EOSABS 0.1 10/01/2012 1044   BASOSABS 0.0 10/01/2012 1044     inc C/D, abd soft + BS  Assessment: s/p Procedure(s): HYSTERECTOMY ABDOMINAL (N/A): stable  Plan: Advance diet Encourage ambulation Discontinue IV fluids  LOS: 1 day    Leyton Magoon M 11/20/2013, 7:11 AM

## 2013-11-21 MED ORDER — OXYCODONE-ACETAMINOPHEN 5-325 MG PO TABS: 1.0000 | ORAL_TABLET | ORAL | Status: DC | PRN

## 2013-11-21 MED ORDER — IBUPROFEN 800 MG PO TABS: 800.0000 mg | ORAL_TABLET | Freq: Three times a day (TID) | ORAL | Status: DC | PRN

## 2013-11-21 NOTE — Discharge Summary (Signed)
Physician Discharge Summary  Patient ID: Katie Salas MRN: [id number] DOB/AGE: Mar 21, 1965 48 y.o.  Admit date: 11/19/2013 Discharge date: 11/21/2013  Admission Diagnoses:leiomyoma, pelvic pain  Discharge Diagnoses: same Active Problems:   Leiomyoma   Discharged Condition: good  Hospital Course: adm for TAH, bilat salpingectomy, LOA  Consults: None  Significant Diagnostic Studies: labs:  Results for orders placed during the hospital encounter of 11/19/13 (from the past 48 hour(s))  CBC     Status: Abnormal   Collection Time    11/20/13  5:50 AM      Result Value Ref Range   WBC 8.4  4.0 - 10.5 K/uL   RBC 3.67 (*) 3.87 - 5.11 MIL/uL   Hemoglobin 9.8 (*) 12.0 - 15.0 g/dL   HCT 30.7 (*) 36.0 - 46.0 %   MCV 83.7  78.0 - 100.0 fL   MCH 26.7  26.0 - 34.0 pg   MCHC 31.9  30.0 - 36.0 g/dL   RDW 13.2  11.5 - 15.5 %   Platelets 228  150 - 400 K/uL    Treatments: surgery: TAH, bilat salpingectomy  Discharge Exam: Blood pressure 98/53, pulse 66, temperature 98.7 F (37.1 C), temperature source Oral, resp. rate 16, height 5\' 7"  (1.702 m), weight 215 lb (97.523 kg), SpO2 96.00%. General appearance: Inc C/D/ abd soft + BS  Disposition: 01-Home or Self Care     Medication List    STOP taking these medications       loratadine 10 MG tablet  Commonly known as:  CLARITIN     multivitamin tablet     vitamin C 1000 MG tablet      TAKE these medications       ibuprofen 800 MG tablet  Commonly known as:  ADVIL,MOTRIN  Take 1 tablet (800 mg total) by mouth every 8 (eight) hours as needed for moderate pain (mild pain).     oxyCODONE-acetaminophen 5-325 MG per tablet  Commonly known as:  PERCOCET/ROXICET  Take 1-2 tablets by mouth every 4 (four) hours as needed for severe pain (moderate to severe pain (when tolerating fluids)).           Follow-up Information   Follow up with Margarette Asal, MD In 1 week. (office will call)    Specialty:  Obstetrics and  Gynecology   Contact information:   [place]  [id number] [phone]       Signed: Margarette Asal 11/21/2013, 8:47 AM

## 2013-11-21 NOTE — Progress Notes (Signed)
Pt discharged home with husband... Discharge instructions given and reviewed with pt and she verbalized understanding... Condition stable... No equipment... Ambulated to car with Janalyn Shy, RN.

## 2013-11-24 NOTE — Progress Notes (Signed)
Post discharge ur review completed. 

## 2014-07-01 ENCOUNTER — Encounter (INDEPENDENT_AMBULATORY_CARE_PROVIDER_SITE_OTHER): Payer: Managed Care, Other (non HMO) | Admitting: Ophthalmology

## 2014-07-01 DIAGNOSIS — H32 Chorioretinal disorders in diseases classified elsewhere: Secondary | ICD-10-CM | POA: Diagnosis not present

## 2014-07-01 DIAGNOSIS — B399 Histoplasmosis, unspecified: Secondary | ICD-10-CM

## 2014-07-01 DIAGNOSIS — H2513 Age-related nuclear cataract, bilateral: Secondary | ICD-10-CM | POA: Diagnosis not present

## 2015-03-15 ENCOUNTER — Other Ambulatory Visit: Payer: Self-pay | Admitting: Oncology

## 2015-03-15 DIAGNOSIS — Z9884 Bariatric surgery status: Secondary | ICD-10-CM

## 2015-03-15 DIAGNOSIS — K909 Intestinal malabsorption, unspecified: Secondary | ICD-10-CM

## 2015-03-15 DIAGNOSIS — D509 Iron deficiency anemia, unspecified: Secondary | ICD-10-CM

## 2015-03-16 ENCOUNTER — Other Ambulatory Visit (INDEPENDENT_AMBULATORY_CARE_PROVIDER_SITE_OTHER): Payer: Managed Care, Other (non HMO)

## 2015-03-16 DIAGNOSIS — D509 Iron deficiency anemia, unspecified: Secondary | ICD-10-CM

## 2015-03-16 DIAGNOSIS — Z9884 Bariatric surgery status: Secondary | ICD-10-CM

## 2015-03-16 DIAGNOSIS — K909 Intestinal malabsorption, unspecified: Secondary | ICD-10-CM

## 2015-03-17 LAB — CBC WITH DIFFERENTIAL/PLATELET
BASOS ABS: 0 10*3/uL (ref 0.0–0.2)
Basos: 0 %
EOS (ABSOLUTE): 0.1 10*3/uL (ref 0.0–0.4)
Eos: 1 %
HEMATOCRIT: 35.8 % (ref 34.0–46.6)
Hemoglobin: 10.8 g/dL — ABNORMAL LOW (ref 11.1–15.9)
Immature Grans (Abs): 0 10*3/uL (ref 0.0–0.1)
Immature Granulocytes: 0 %
LYMPHS ABS: 1.3 10*3/uL (ref 0.7–3.1)
Lymphs: 26 %
MCH: 24.3 pg — AB (ref 26.6–33.0)
MCHC: 30.2 g/dL — ABNORMAL LOW (ref 31.5–35.7)
MCV: 81 fL (ref 79–97)
MONOCYTES: 6 %
Monocytes Absolute: 0.3 10*3/uL (ref 0.1–0.9)
Neutrophils Absolute: 3.4 10*3/uL (ref 1.4–7.0)
Neutrophils: 67 %
Platelets: 275 10*3/uL (ref 150–379)
RBC: 4.44 x10E6/uL (ref 3.77–5.28)
RDW: 14.6 % (ref 12.3–15.4)
WBC: 5 10*3/uL (ref 3.4–10.8)

## 2015-03-17 LAB — COMPREHENSIVE METABOLIC PANEL
ALT: 24 IU/L (ref 0–32)
AST: 28 IU/L (ref 0–40)
Albumin/Globulin Ratio: 1.5 (ref 1.1–2.5)
Albumin: 3.5 g/dL (ref 3.5–5.5)
Alkaline Phosphatase: 131 IU/L — ABNORMAL HIGH (ref 39–117)
BUN/Creatinine Ratio: 20 (ref 9–23)
BUN: 12 mg/dL (ref 6–24)
Bilirubin Total: 0.5 mg/dL (ref 0.0–1.2)
CHLORIDE: 105 mmol/L (ref 96–106)
CO2: 23 mmol/L (ref 18–29)
Calcium: 8.5 mg/dL — ABNORMAL LOW (ref 8.7–10.2)
Creatinine, Ser: 0.59 mg/dL (ref 0.57–1.00)
GFR calc non Af Amer: 109 mL/min/{1.73_m2} (ref >59)
GFR, EST AFRICAN AMERICAN: 125 mL/min/{1.73_m2} (ref >59)
GLUCOSE: 85 mg/dL (ref 65–99)
Globulin, Total: 2.4 g/dL (ref 1.5–4.5)
Potassium: 4.6 mmol/L (ref 3.5–5.2)
Sodium: 141 mmol/L (ref 134–144)
Total Protein: 5.9 g/dL — ABNORMAL LOW (ref 6.0–8.5)

## 2015-03-17 LAB — IRON AND TIBC
IRON SATURATION: 6 % — AB (ref 15–55)
Iron: 23 ug/dL — ABNORMAL LOW (ref 27–159)
Total Iron Binding Capacity: 405 ug/dL (ref 250–450)
UIBC: 382 ug/dL (ref 131–425)

## 2015-03-17 LAB — FERRITIN: FERRITIN: 8 ng/mL — AB (ref 15–150)

## 2015-03-23 ENCOUNTER — Ambulatory Visit (INDEPENDENT_AMBULATORY_CARE_PROVIDER_SITE_OTHER): Payer: Managed Care, Other (non HMO) | Admitting: Oncology

## 2015-03-23 ENCOUNTER — Encounter: Payer: Self-pay | Admitting: Oncology

## 2015-03-23 ENCOUNTER — Other Ambulatory Visit (HOSPITAL_COMMUNITY): Payer: Self-pay | Admitting: *Deleted

## 2015-03-23 VITALS — BP 127/73 | HR 66 | Temp 98.0°F | Ht 67.5 in | Wt 209.6 lb

## 2015-03-23 DIAGNOSIS — K909 Intestinal malabsorption, unspecified: Secondary | ICD-10-CM

## 2015-03-23 DIAGNOSIS — Z9884 Bariatric surgery status: Secondary | ICD-10-CM | POA: Diagnosis not present

## 2015-03-23 DIAGNOSIS — D509 Iron deficiency anemia, unspecified: Secondary | ICD-10-CM | POA: Diagnosis not present

## 2015-03-23 NOTE — Patient Instructions (Signed)
schedule feraheme IV iron with [facility] short stay for this Friday 3/3 or 1st available: weekly x 2 doses Cbc, iron studies every 6 months next 09/14/15 MD visit 1 year

## 2015-03-23 NOTE — Progress Notes (Signed)
Patient ID: Katie Salas, female   DOB: [birth year], 50 y.o.   MRN: [id number] Hematology and Oncology Follow Up Visit  Katie Salas [id number] 1965/03/20 50 y.o. 03/23/2015 7:42 PM   Principle Diagnosis: Encounter Diagnoses  Name Primary?  . Malabsorption of iron   . Anemia, iron deficiency Yes  . Gastric bypass status for obesity    clinical summary:  50 year old woman who has iron absorption anemia due to previous gastric bypass surgery. I have followed her since 2011. She is asymptomatic at this time. She had missed an appointment and lab analysis in 2012. When she did get in to have labs checked, ferritin had fallen down to 7 recorded on 11/27/2011. Hemoglobin 11.4 at that time compared with most recent prior by you in our office of 12.6 on 06/03/2010. She was given a dose of IV iron here on 12/13/2011. Reviewing her flow sheet, it appears that she went 3 years in between iron doses.  Interim History:   She stopped coming for routine follow-up visits and called to reestablish with me now 3 years since her last visit because she was feeling tired and felt she was getting anemic again. I obtained lab last week in anticipation of today's visit and interestingly enough, hemoglobin last recorded at 9.8 in October 2015 actually came up a gram to 10.8 on 03/16/2015. However, serum iron is 23 and ferritin is 8. Chemistry profile shows random glucose 125. She reports that she started to get some itching in her legs the last time she got a dose of iron and nurses gave her some Benadryl. She was able to continue the iron infusion without a problem. Her weight is holding steady. 210 pounds today compared with 215 pounds last recorded in October 2015.  She's had no interim medical problems. Since last visit with me she had a hysterectomy in October 2015.  Review of systems unremarkable. Appetite is good. Weight is stable. No change in bowel habit. She is not taking any chronic medications. She  will occasionally take a multivitamin. Her family is doing well. She has 2 boys ages 20 and 56.  Medications: reviewed  Allergies:  Allergies  Allergen Reactions  . Darvocet [Propoxyphene N-Acetaminophen] Shortness Of Breath    Does OK with percocet  . Adhesive [Tape] Other (See Comments)    blisters  . Sulfa Antibiotics Swelling    Review of Systems: See interim history Remaining ROS negative:   Physical Exam: Blood pressure 127/73, pulse 66, temperature 98 F (36.7 C), temperature source Oral, height 5' 7.5" (1.715 m), weight 209 lb 9.6 oz (95.074 kg), last menstrual period 11/11/2013, SpO2 100 %. Wt Readings from Last 3 Encounters:  03/23/15 209 lb 9.6 oz (95.074 kg)  11/19/13 215 lb (97.523 kg)  11/18/13 215 lb (97.523 kg)     General appearance: Pleasant well-nourished Caucasian woman HENNT: Pharynx no erythema, exudate, mass, or ulcer. No thyromegaly or thyroid nodules Lymph nodes: No cervical, supraclavicular, or axillary lymphadenopathy Breasts: Lungs: Clear to auscultation, resonant to percussion throughout Heart: Regular rhythm, no murmur, no gallop, no rub, no click, no edema Abdomen: Soft, nontender, normal bowel sounds, no mass, no organomegaly Extremities: No edema, no calf tenderness Musculoskeletal: no joint deformities GU:  Vascular: Carotid pulses 2+, no bruits, Neurologic: Alert, oriented, PERRLA, , cranial nerves grossly normal, motor strength 5 over 5, reflexes 1+ symmetric, upper body coordination normal, gait normal, Skin: No rash or ecchymosis  Lab Results: CBC W/Diff    Component Value Date/Time  WBC 5.0 03/16/2015 0945   WBC 8.4 11/20/2013 0550   WBC 5.0 10/01/2012 1044   RBC 4.44 03/16/2015 0945   RBC 3.67* 11/20/2013 0550   RBC 4.22 10/01/2012 1044   HGB 9.8* 11/20/2013 0550   HGB 12.0 10/01/2012 1044   HCT 35.8 03/16/2015 0945   HCT 30.7* 11/20/2013 0550   HCT 37.2 10/01/2012 1044   PLT 275 03/16/2015 0945   PLT 228 11/20/2013  0550   PLT 200 10/01/2012 1044   MCV 81 03/16/2015 0945   MCV 83.7 11/20/2013 0550   MCV 88.2 10/01/2012 1044   MCH 24.3* 03/16/2015 0945   MCH 26.7 11/20/2013 0550   MCH 28.4 10/01/2012 1044   MCHC 30.2* 03/16/2015 0945   MCHC 31.9 11/20/2013 0550   MCHC 32.3 10/01/2012 1044   RDW 14.6 03/16/2015 0945   RDW 13.2 11/20/2013 0550   RDW 13.1 10/01/2012 1044   LYMPHSABS 1.3 03/16/2015 0945   LYMPHSABS 1.3 10/01/2012 1044   MONOABS 0.4 10/01/2012 1044   EOSABS 0.1 03/16/2015 0945   EOSABS 0.1 10/01/2012 1044   BASOSABS 0.0 03/16/2015 0945   BASOSABS 0.0 10/01/2012 1044     Chemistry      Component Value Date/Time   NA 141 03/16/2015 0945   NA 140 11/27/2011 1515   NA 141 06/03/2010 1936   K 4.6 03/16/2015 0945   K 3.8 11/27/2011 1515   CL 105 03/16/2015 0945   CL 109* 11/27/2011 1515   CO2 23 03/16/2015 0945   CO2 27 11/27/2011 1515   BUN 12 03/16/2015 0945   BUN 14.0 11/27/2011 1515   BUN 11 06/03/2010 1936   CREATININE 0.59 03/16/2015 0945   CREATININE 0.6 11/27/2011 1515      Component Value Date/Time   CALCIUM 8.5* 03/16/2015 0945   CALCIUM 8.5 11/27/2011 1515   ALKPHOS 131* 03/16/2015 0945   ALKPHOS 160* 11/27/2011 1515   AST 28 03/16/2015 0945   AST 28 11/27/2011 1515   ALT 24 03/16/2015 0945   ALT 25 11/27/2011 1515   BILITOT 0.5 03/16/2015 0945   BILITOT 0.81 11/27/2011 1515   BILITOT 0.9 11/27/2007 0604       Radiological Studies: No results found.  Impression:  #1. Iron malabsorption #2. Iron deficiency anemia secondary to #1 #3. History of resected thymoma #4. Degenerative arthritis  I will go ahead and order intravenous iron 510 mg weekly 2. I would like her to check blood counts and a ferritin every 4 months. Fortunately, she gets a lot of mileage out of the iron and has not had a dose since November 2013.   CC: Patient Care Team: Antony Blackbird, MD as PCP - General (Family Medicine)   Annia Belt, MD 2/28/20177:42 PM

## 2015-03-24 ENCOUNTER — Encounter (HOSPITAL_COMMUNITY)
Admission: RE | Admit: 2015-03-24 | Discharge: 2015-03-24 | Disposition: A | Discharge status: Home / Self Care | Payer: Managed Care, Other (non HMO) | Source: Ambulatory Visit | Attending: Oncology | Admitting: Oncology

## 2015-03-24 VITALS — BP 128/73 | HR 64 | Temp 98.0°F | Resp 20 | Ht 67.5 in | Wt 209.0 lb

## 2015-03-24 DIAGNOSIS — K909 Intestinal malabsorption, unspecified: Secondary | ICD-10-CM

## 2015-03-24 DIAGNOSIS — D509 Iron deficiency anemia, unspecified: Secondary | ICD-10-CM | POA: Diagnosis present

## 2015-03-24 MED ORDER — SODIUM CHLORIDE 0.9 % IV SOLN
510.0000 mg | INTRAVENOUS | Status: DC
  Administered 2015-03-24: 510 mg via INTRAVENOUS
  Filled 2015-03-24: qty 17

## 2015-03-30 ENCOUNTER — Other Ambulatory Visit (HOSPITAL_COMMUNITY): Payer: Self-pay | Admitting: *Deleted

## 2015-03-31 ENCOUNTER — Ambulatory Visit (HOSPITAL_COMMUNITY)
Admission: RE | Admit: 2015-03-31 | Discharge: 2015-03-31 | Disposition: A | Discharge status: Home / Self Care | Payer: Managed Care, Other (non HMO) | Source: Ambulatory Visit | Attending: Oncology | Admitting: Oncology

## 2015-03-31 VITALS — BP 123/71 | HR 64 | Temp 98.2°F | Resp 20 | Ht 67.0 in | Wt 209.0 lb

## 2015-03-31 DIAGNOSIS — K909 Intestinal malabsorption, unspecified: Secondary | ICD-10-CM

## 2015-03-31 DIAGNOSIS — D509 Iron deficiency anemia, unspecified: Secondary | ICD-10-CM

## 2015-03-31 MED ORDER — SODIUM CHLORIDE 0.9 % IV SOLN
510.0000 mg | INTRAVENOUS | Status: AC
  Administered 2015-03-31: 510 mg via INTRAVENOUS
  Filled 2015-03-31: qty 17

## 2015-07-14 ENCOUNTER — Ambulatory Visit (INDEPENDENT_AMBULATORY_CARE_PROVIDER_SITE_OTHER): Payer: Managed Care, Other (non HMO) | Admitting: Ophthalmology

## 2015-09-01 ENCOUNTER — Ambulatory Visit (INDEPENDENT_AMBULATORY_CARE_PROVIDER_SITE_OTHER): Payer: Managed Care, Other (non HMO) | Admitting: Ophthalmology

## 2015-09-01 DIAGNOSIS — H43813 Vitreous degeneration, bilateral: Secondary | ICD-10-CM

## 2015-09-01 DIAGNOSIS — B399 Histoplasmosis, unspecified: Secondary | ICD-10-CM | POA: Diagnosis not present

## 2015-09-01 DIAGNOSIS — H32 Chorioretinal disorders in diseases classified elsewhere: Secondary | ICD-10-CM | POA: Diagnosis not present

## 2015-09-01 DIAGNOSIS — H2513 Age-related nuclear cataract, bilateral: Secondary | ICD-10-CM | POA: Diagnosis not present

## 2015-09-13 ENCOUNTER — Telehealth: Payer: Self-pay | Admitting: Family Medicine

## 2015-09-13 NOTE — Telephone Encounter (Signed)
APT. REMINDER CALL, NO ANSWER, NO VOICEMAIL °

## 2015-09-14 ENCOUNTER — Encounter (INDEPENDENT_AMBULATORY_CARE_PROVIDER_SITE_OTHER): Payer: Self-pay

## 2015-09-14 ENCOUNTER — Other Ambulatory Visit (INDEPENDENT_AMBULATORY_CARE_PROVIDER_SITE_OTHER): Payer: Managed Care, Other (non HMO)

## 2015-09-14 DIAGNOSIS — K909 Intestinal malabsorption, unspecified: Secondary | ICD-10-CM

## 2015-09-14 DIAGNOSIS — D509 Iron deficiency anemia, unspecified: Secondary | ICD-10-CM | POA: Diagnosis not present

## 2015-09-15 LAB — CBC WITH DIFFERENTIAL/PLATELET
Basophils Absolute: 0 10*3/uL (ref 0.0–0.2)
Basos: 0 %
EOS (ABSOLUTE): 0.1 10*3/uL (ref 0.0–0.4)
EOS: 1 %
HEMATOCRIT: 38.5 % (ref 34.0–46.6)
HEMOGLOBIN: 12.7 g/dL (ref 11.1–15.9)
IMMATURE GRANS (ABS): 0 10*3/uL (ref 0.0–0.1)
Immature Granulocytes: 0 %
LYMPHS: 27 %
Lymphocytes Absolute: 1.3 10*3/uL (ref 0.7–3.1)
MCH: 28.7 pg (ref 26.6–33.0)
MCHC: 33 g/dL (ref 31.5–35.7)
MCV: 87 fL (ref 79–97)
MONOCYTES: 7 %
Monocytes Absolute: 0.3 10*3/uL (ref 0.1–0.9)
NEUTROS PCT: 65 %
Neutrophils Absolute: 3.1 10*3/uL (ref 1.4–7.0)
Platelets: 232 10*3/uL (ref 150–379)
RBC: 4.43 x10E6/uL (ref 3.77–5.28)
RDW: 13.2 % (ref 12.3–15.4)
WBC: 4.9 10*3/uL (ref 3.4–10.8)

## 2015-09-15 LAB — IRON AND TIBC
Iron Saturation: 21 % (ref 15–55)
Iron: 54 ug/dL (ref 27–159)
Total Iron Binding Capacity: 262 ug/dL (ref 250–450)
UIBC: 208 ug/dL (ref 131–425)

## 2015-09-15 LAB — FERRITIN: FERRITIN: 91 ng/mL (ref 15–150)

## 2015-09-16 ENCOUNTER — Telehealth: Payer: Self-pay | Admitting: *Deleted

## 2015-09-16 NOTE — Telephone Encounter (Signed)
-----   Message from Annia Belt, MD sent at 09/15/2015  4:34 PM EDT ----- Call pt: Hb 12.7; iron levels good

## 2015-09-16 NOTE — Telephone Encounter (Signed)
Pt called / informed "Hb 12.7; iron levels good 12.7" per Dr Beryle Beams. Ask about My Chart - informed she can register and view labs at any time.

## 2016-03-16 ENCOUNTER — Other Ambulatory Visit (INDEPENDENT_AMBULATORY_CARE_PROVIDER_SITE_OTHER): Payer: Commercial Managed Care - PPO

## 2016-03-16 ENCOUNTER — Encounter (INDEPENDENT_AMBULATORY_CARE_PROVIDER_SITE_OTHER): Payer: Self-pay

## 2016-03-16 DIAGNOSIS — K909 Intestinal malabsorption, unspecified: Secondary | ICD-10-CM

## 2016-03-16 DIAGNOSIS — D509 Iron deficiency anemia, unspecified: Secondary | ICD-10-CM | POA: Diagnosis not present

## 2016-03-17 LAB — CBC WITH DIFFERENTIAL/PLATELET
BASOS: 0 %
Basophils Absolute: 0 10*3/uL (ref 0.0–0.2)
EOS (ABSOLUTE): 0.1 10*3/uL (ref 0.0–0.4)
Eos: 2 %
Hematocrit: 37.2 % (ref 34.0–46.6)
Hemoglobin: 12.4 g/dL (ref 11.1–15.9)
IMMATURE GRANULOCYTES: 0 %
Immature Grans (Abs): 0 10*3/uL (ref 0.0–0.1)
LYMPHS: 22 %
Lymphocytes Absolute: 1.2 10*3/uL (ref 0.7–3.1)
MCH: 29 pg (ref 26.6–33.0)
MCHC: 33.3 g/dL (ref 31.5–35.7)
MCV: 87 fL (ref 79–97)
Monocytes Absolute: 0.4 10*3/uL (ref 0.1–0.9)
Monocytes: 7 %
NEUTROS PCT: 69 %
Neutrophils Absolute: 3.8 10*3/uL (ref 1.4–7.0)
PLATELETS: 241 10*3/uL (ref 150–379)
RBC: 4.27 x10E6/uL (ref 3.77–5.28)
RDW: 13.8 % (ref 12.3–15.4)
WBC: 5.4 10*3/uL (ref 3.4–10.8)

## 2016-03-17 LAB — IRON AND TIBC
Iron Saturation: 19 % (ref 15–55)
Iron: 51 ug/dL (ref 27–159)
Total Iron Binding Capacity: 269 ug/dL (ref 250–450)
UIBC: 218 ug/dL (ref 131–425)

## 2016-03-17 LAB — FERRITIN: Ferritin: 65 ng/mL (ref 15–150)

## 2016-03-23 ENCOUNTER — Telehealth: Payer: Self-pay | Admitting: *Deleted

## 2016-03-23 NOTE — Telephone Encounter (Signed)
-----   Message from Annia Belt, MD sent at 03/17/2016 12:53 PM EST ----- Call pt: Hb & iron levels good

## 2016-03-23 NOTE — Telephone Encounter (Signed)
Pt called / informed "Hb & iron levels good " per Dr Beryle Beams.  She has an appt on Monday 3/5 -wants to know if she should keep this appt or not. Thanks

## 2016-03-24 NOTE — Telephone Encounter (Signed)
-----   Message from Annia Belt, MD sent at 03/24/2016 11:32 AM EST ----- She can re-schedule but need to make sure she has lab appts going forward and I still like to see people at least once a year if I am writing orders for IV meds. ----- Message ----- From: Ebbie Latus, RN Sent: 03/23/2016  10:12 AM To: Annia Belt, MD  Dr Darnell Level  Since I called her with lab results; she wants to know if she needs to keep her appt on Monday. Nickie Warwick

## 2016-03-24 NOTE — Telephone Encounter (Signed)
Called pt - no answer; left message to keep appt on Monday since this her yearly visit and Dr Darnell Level would like to see her if he needs to write any orders for IV meds but to call if she has any questions.

## 2016-03-27 ENCOUNTER — Ambulatory Visit: Payer: Managed Care, Other (non HMO) | Admitting: Oncology

## 2017-07-02 ENCOUNTER — Other Ambulatory Visit: Payer: Self-pay | Admitting: Physician Assistant

## 2017-07-02 ENCOUNTER — Ambulatory Visit
Admission: RE | Admit: 2017-07-02 | Discharge: 2017-07-02 | Disposition: A | Discharge status: Home / Self Care | Payer: Commercial Managed Care - PPO | Source: Ambulatory Visit | Attending: Physician Assistant | Admitting: Physician Assistant

## 2017-07-02 DIAGNOSIS — R0601 Orthopnea: Secondary | ICD-10-CM

## 2018-01-01 ENCOUNTER — Other Ambulatory Visit: Payer: Self-pay | Admitting: Obstetrics and Gynecology

## 2018-01-01 DIAGNOSIS — N644 Mastodynia: Secondary | ICD-10-CM

## 2018-01-04 ENCOUNTER — Ambulatory Visit
Admission: RE | Admit: 2018-01-04 | Discharge: 2018-01-04 | Disposition: A | Discharge status: Home / Self Care | Payer: Commercial Managed Care - PPO | Source: Ambulatory Visit | Attending: Obstetrics and Gynecology | Admitting: Obstetrics and Gynecology

## 2018-01-04 DIAGNOSIS — N644 Mastodynia: Secondary | ICD-10-CM

## 2018-03-25 DIAGNOSIS — Z6836 Body mass index (BMI) 36.0-36.9, adult: Secondary | ICD-10-CM | POA: Diagnosis not present

## 2018-03-25 DIAGNOSIS — R635 Abnormal weight gain: Secondary | ICD-10-CM | POA: Diagnosis not present

## 2018-03-25 DIAGNOSIS — N951 Menopausal and female climacteric states: Secondary | ICD-10-CM | POA: Diagnosis not present

## 2019-11-05 ENCOUNTER — Other Ambulatory Visit (HOSPITAL_COMMUNITY): Payer: Self-pay

## 2019-11-06 ENCOUNTER — Other Ambulatory Visit (HOSPITAL_COMMUNITY): Payer: Self-pay | Admitting: Physician Assistant

## 2019-11-06 NOTE — Progress Notes (Signed)
I connected by phone with Katie Salas on 11/06/2019 at 4:45 PM to discuss the potential use of a new treatment for mild to moderate COVID-19 viral infection in non-hospitalized patients.  This patient is a 54 y.o. female that meets the FDA criteria for Emergency Use Authorization of COVID monoclonal antibody casirivimab/imdevimab or bamlanivimab/eteseviamb.  Has a (+) direct SARS-CoV-2 viral test result  Has mild or moderate COVID-19   Is NOT hospitalized due to COVID-19  Is within 10 days of symptom onset  Has at least one of the high risk factor(s) for progression to severe COVID-19 and/or hospitalization as defined in EUA.  Specific high risk criteria : BMI > 25   I have spoken and communicated the following to the patient or parent/caregiver regarding COVID monoclonal antibody treatment:  1. FDA has authorized the emergency use for the treatment of mild to moderate COVID-19 in adults and pediatric patients with positive results of direct SARS-CoV-2 viral testing who are 28 years of age and older weighing at least 40 kg, and who are at high risk for progressing to severe COVID-19 and/or hospitalization.  2. The significant known and potential risks and benefits of COVID monoclonal antibody, and the extent to which such potential risks and benefits are unknown.  3. Information on available alternative treatments and the risks and benefits of those alternatives, including clinical trials.  4. Patients treated with COVID monoclonal antibody should continue to self-isolate and use infection control measures (e.g., wear mask, isolate, social distance, avoid sharing personal items, clean and disinfect "high touch" surfaces, and frequent handwashing) according to CDC guidelines.   5. The patient or parent/caregiver has the option to accept or refuse COVID monoclonal antibody treatment.  After reviewing this information with the patient, the patient has agreed to receive one of the  available covid 19 monoclonal antibodies and will be provided an appropriate fact sheet prior to infusion. Konrad Felix, PA-C 11/06/2019 4:45 PM

## 2019-11-07 ENCOUNTER — Ambulatory Visit (HOSPITAL_COMMUNITY)
Admission: RE | Admit: 2019-11-07 | Discharge: 2019-11-07 | Disposition: A | Discharge status: Home / Self Care | Payer: Commercial Managed Care - PPO | Source: Ambulatory Visit | Attending: Pulmonary Disease | Admitting: Pulmonary Disease

## 2019-11-07 DIAGNOSIS — U071 COVID-19: Secondary | ICD-10-CM | POA: Diagnosis present

## 2019-11-07 DIAGNOSIS — Z23 Encounter for immunization: Secondary | ICD-10-CM | POA: Insufficient documentation

## 2019-11-07 MED ORDER — ALBUTEROL SULFATE HFA 108 (90 BASE) MCG/ACT IN AERS: 2.0000 | INHALATION_SPRAY | Freq: Once | RESPIRATORY_TRACT | Status: DC | PRN

## 2019-11-07 MED ORDER — DIPHENHYDRAMINE HCL 50 MG/ML IJ SOLN: 50.0000 mg | Freq: Once | INTRAMUSCULAR | Status: DC | PRN

## 2019-11-07 MED ORDER — SODIUM CHLORIDE 0.9 % IV SOLN: INTRAVENOUS | Status: DC | PRN

## 2019-11-07 MED ORDER — EPINEPHRINE 0.3 MG/0.3ML IJ SOAJ: 0.3000 mg | Freq: Once | INTRAMUSCULAR | Status: DC | PRN

## 2019-11-07 MED ORDER — SODIUM CHLORIDE 0.9 % IV SOLN: Freq: Once | INTRAVENOUS | Status: AC

## 2019-11-07 MED ORDER — FAMOTIDINE IN NACL 20-0.9 MG/50ML-% IV SOLN: 20.0000 mg | Freq: Once | INTRAVENOUS | Status: DC | PRN

## 2019-11-07 MED ORDER — METHYLPREDNISOLONE SODIUM SUCC 125 MG IJ SOLR: 125.0000 mg | Freq: Once | INTRAMUSCULAR | Status: DC | PRN

## 2019-11-07 NOTE — Discharge Instructions (Signed)

## 2019-11-07 NOTE — Progress Notes (Signed)
  Diagnosis: COVID-19  Physician: Dr. Joya Gaskins  Procedure: [facility] Infusion [facility] Med: bamlanivimab\etesevimab infusion - Provided patient with bamlanimivab\etesevimab fact sheet for patients, parents and caregivers prior to infusion.  Complications: No immediate complications noted.  Discharge: Discharged home   Winfred Leeds 11/07/2019

## 2019-11-26 ENCOUNTER — Other Ambulatory Visit: Payer: Self-pay

## 2019-11-26 ENCOUNTER — Other Ambulatory Visit: Payer: Self-pay | Admitting: Physician Assistant

## 2019-11-26 ENCOUNTER — Ambulatory Visit
Admission: RE | Admit: 2019-11-26 | Discharge: 2019-11-26 | Disposition: A | Discharge status: Home / Self Care | Payer: Commercial Managed Care - PPO | Source: Ambulatory Visit | Attending: Physician Assistant | Admitting: Physician Assistant

## 2019-11-26 DIAGNOSIS — R059 Cough, unspecified: Secondary | ICD-10-CM

## 2019-11-28 ENCOUNTER — Ambulatory Visit
Admission: RE | Admit: 2019-11-28 | Discharge: 2019-11-28 | Disposition: A | Discharge status: Home / Self Care | Payer: Commercial Managed Care - PPO | Source: Ambulatory Visit | Attending: Physician Assistant | Admitting: Physician Assistant

## 2019-11-28 ENCOUNTER — Other Ambulatory Visit: Payer: Self-pay | Admitting: Physician Assistant

## 2019-11-28 DIAGNOSIS — R7989 Other specified abnormal findings of blood chemistry: Secondary | ICD-10-CM

## 2019-11-28 DIAGNOSIS — R9389 Abnormal findings on diagnostic imaging of other specified body structures: Secondary | ICD-10-CM

## 2019-11-28 MED ORDER — IOPAMIDOL (ISOVUE-370) INJECTION 76%
75.0000 mL | Freq: Once | INTRAVENOUS | Status: AC | PRN
  Administered 2019-11-28: 75 mL via INTRAVENOUS

## 2019-12-07 ENCOUNTER — Ambulatory Visit
Admission: RE | Admit: 2019-12-07 | Discharge: 2019-12-07 | Disposition: A | Discharge status: Home / Self Care | Payer: Commercial Managed Care - PPO | Source: Ambulatory Visit | Attending: Emergency Medicine | Admitting: Emergency Medicine

## 2019-12-07 ENCOUNTER — Other Ambulatory Visit: Payer: Self-pay

## 2019-12-07 VITALS — BP 138/78 | HR 76 | Temp 98.0°F | Resp 20

## 2019-12-07 DIAGNOSIS — U071 COVID-19: Secondary | ICD-10-CM

## 2019-12-07 DIAGNOSIS — R059 Cough, unspecified: Secondary | ICD-10-CM | POA: Diagnosis not present

## 2019-12-07 DIAGNOSIS — R0789 Other chest pain: Secondary | ICD-10-CM

## 2019-12-07 MED ORDER — DEXAMETHASONE SODIUM PHOSPHATE 10 MG/ML IJ SOLN
10.0000 mg | Freq: Once | INTRAMUSCULAR | Status: AC
  Administered 2019-12-07: 10 mg via INTRAMUSCULAR

## 2019-12-07 MED ORDER — ALBUTEROL SULFATE HFA 108 (90 BASE) MCG/ACT IN AERS: 1.0000 | INHALATION_SPRAY | Freq: Four times a day (QID) | RESPIRATORY_TRACT | 0 refills | Status: DC | PRN

## 2019-12-07 MED ORDER — BENZONATATE 100 MG PO CAPS: 100.0000 mg | ORAL_CAPSULE | Freq: Three times a day (TID) | ORAL | 0 refills | Status: DC

## 2019-12-07 MED ORDER — PREDNISONE 10 MG (21) PO TBPK: ORAL_TABLET | Freq: Every day | ORAL | 0 refills | Status: DC

## 2019-12-07 NOTE — ED Triage Notes (Signed)
Pt presents with worsening cough post covid . Pt states she was improving then cough developed again, recently finished z pack

## 2019-12-07 NOTE — ED Provider Notes (Signed)
[facility]   [id number] 12/07/19 Arrival Time: 3382   CC: COVID infection; cough  SUBJECTIVE: History from: patient.  Katie Salas is a 54 y.o. female who presents with worsening dry cough x 2 weeks.  Diagnosed with covid on 11/06/19.  Had CT scan and chest x-ray on 11/28/19 and 11/26/19. Negative for PE, consistent with atypical pneumonia, could not rule out covid infection.  Was treated with z-pak without relief.  Symptoms are made worse at night.  Denies previous symptoms in the past.   Also mentions congestion, sore throat, and chest pressure x few days (states chest pressure is a new symptoms and occurred after CT scan).  Denies fever, chills, SOB, wheezing, chest pain, nausea, changes in bowel or bladder habits.     ROS: As per HPI.  All other pertinent ROS negative.     Past Medical History:  Diagnosis Date  . Anemia 2008  . Gastric bypass status for obesity 03/27/2012  . GERD (gastroesophageal reflux disease)   . Hypertension    with pregnancy  . PONV (postoperative nausea and vomiting)   . Thymoma 03/27/2012   Past Surgical History:  Procedure Laterality Date  . ABDOMINAL HYSTERECTOMY N/A 11/19/2013   Procedure: HYSTERECTOMY ABDOMINAL;  Surgeon: Margarette Asal, MD;  Location: [facility];  Service: Gynecology;  Laterality: N/A;  . BREAST SURGERY    . CESAREAN SECTION    . GASTRIC BYPASS  2003  . NECK SURGERY     cervical fusion   Allergies  Allergen Reactions  . Darvocet [Propoxyphene N-Acetaminophen] Shortness Of Breath    Does OK with percocet  . Adhesive [Tape] Other (See Comments)    blisters  . Sulfa Antibiotics Swelling   No current facility-administered medications on file prior to encounter.   Current Outpatient Medications on File Prior to Encounter  Medication Sig Dispense Refill  . ibuprofen (ADVIL,MOTRIN) 800 MG tablet Take 1 tablet (800 mg total) by mouth every 8 (eight) hours as needed for moderate pain (mild pain). 30 tablet 1    Social History   Socioeconomic History  . Marital status: Married    Spouse name: Not on file  . Number of children: Not on file  . Years of education: Not on file  . Highest education level: Not on file  Occupational History  . Not on file  Tobacco Use  . Smoking status: Never Smoker  Substance and Sexual Activity  . Alcohol use: Yes    Alcohol/week: 0.0 standard drinks    Comment: rarely  . Drug use: No  . Sexual activity: Not on file  Other Topics Concern  . Not on file  Social History Narrative  . Not on file   Social Determinants of Health   Financial Resource Strain:   . Difficulty of Paying Living Expenses: Not on file  Food Insecurity:   . Worried About Charity fundraiser in the Last Year: Not on file  . Ran Out of Food in the Last Year: Not on file  Transportation Needs:   . Lack of Transportation (Medical): Not on file  . Lack of Transportation (Non-Medical): Not on file  Physical Activity:   . Days of Exercise per Week: Not on file  . Minutes of Exercise per Session: Not on file  Stress:   . Feeling of Stress : Not on file  Social Connections:   . Frequency of Communication with Friends and Family: Not on file  . Frequency of Social Gatherings with Friends  and Family: Not on file  . Attends Religious Services: Not on file  . Active Member of Clubs or Organizations: Not on file  . Attends Archivist Meetings: Not on file  . Marital Status: Not on file  Intimate Partner Violence:   . Fear of Current or Ex-Partner: Not on file  . Emotionally Abused: Not on file  . Physically Abused: Not on file  . Sexually Abused: Not on file   Family History  Family history unknown: Yes    OBJECTIVE:  Vitals:   12/07/19 1347  BP: 138/78  Pulse: 76  Resp: 20  Temp: 98 F (36.7 C)  SpO2: 98%    General appearance: alert; fatigued appearing, nontoxic; speaking in full sentences and tolerating own secretions HEENT: NCAT; Ears: EACs clear, TMs  pearly gray; Eyes: PERRL.  EOM grossly intact. Nose: nares patent without rhinorrhea, Throat: oropharynx clear, tonsils non erythematous or enlarged, uvula midline  Neck: supple without LAD Lungs: unlabored respirations, symmetrical air entry; cough: persistent; no respiratory distress; CTAB Heart: regular rate and rhythm.  Skin: warm and dry Psychological: alert and cooperative; normal mood and affect   EKG:  EKG normal sinus rhythm without ST elevations, depressions, or prolonged PR interval.  No narrowing or widening of the QRS complexes.    ASSESSMENT & PLAN:  1. Cough   2. COVID-19 virus infection   3. Chest pressure     Meds ordered this encounter  Medications  . dexamethasone (DECADRON) injection 10 mg  . predniSONE (STERAPRED UNI-PAK 21 TAB) 10 MG (21) TBPK tablet    Sig: Take by mouth daily. Take 6 tabs by mouth daily  for 2 days, then 5 tabs for 2 days, then 4 tabs for 2 days, then 3 tabs for 2 days, 2 tabs for 2 days, then 1 tab by mouth daily for 2 days    Dispense:  42 tablet    Refill:  0    Order Specific Question:   Supervising Provider    Answer:   Raylene Everts [[id number]]  . albuterol (VENTOLIN HFA) 108 (90 Base) MCG/ACT inhaler    Sig: Inhale 1-2 puffs into the lungs every 6 (six) hours as needed for wheezing or shortness of breath.    Dispense:  18 g    Refill:  0    Order Specific Question:   Supervising Provider    Answer:   Raylene Everts [[id number]]  . benzonatate (TESSALON) 100 MG capsule    Sig: Take 1 capsule (100 mg total) by mouth every 8 (eight) hours.    Dispense:  21 capsule    Refill:  0    Order Specific Question:   Supervising Provider    Answer:   Raylene Everts [[id number]]    Unable to rule out cardiac disease or blood clot in urgent care setting.  Offered patient further evaluation and management in the ED.  Patient declines at this time and would like to try outpatient therapy first.  Aware of the risk associated with this  decision including missed diagnosis, organ damage, organ failure, and/or death.  Patient aware and in agreement.     EKG, vital signs and exam reassuring Get plenty of rest and push fluids Steroid shot given in office Prednisone prescribed.  Take as directed and to completion Tessalon Perles prescribed for cough Albuterol inhaler prescribed Use OTC medications like ibuprofen or tylenol as needed fever or pain Call or go to the ED if you have any  new or worsening symptoms such as fever, worsening cough, shortness of breath, chest tightness, chest pain, turning blue, changes in mental status, etc...   Reviewed expectations re: course of current medical issues. Questions answered. Outlined signs and symptoms indicating need for more acute intervention. Patient verbalized understanding. After Visit Summary given.         Lestine Box, PA-C 12/07/19 1440

## 2019-12-07 NOTE — Discharge Instructions (Addendum)
Unable to rule out cardiac disease or blood clot in urgent care setting.  Offered patient further evaluation and management in the ED.  Patient declines at this time and would like to try outpatient therapy first.  Aware of the risk associated with this decision including missed diagnosis, organ damage, organ failure, and/or death.  Patient aware and in agreement.     EKG, vital signs and exam reassuring Get plenty of rest and push fluids Steroid shot given in office Prednisone prescribed.  Take as directed and to completion Tessalon Perles prescribed for cough Albuterol inhaler prescribed Use OTC medications like ibuprofen or tylenol as needed fever or pain Call or go to the ED if you have any new or worsening symptoms such as fever, worsening cough, shortness of breath, chest tightness, chest pain, turning blue, changes in mental status, etc..Marland Kitchen

## 2019-12-12 ENCOUNTER — Ambulatory Visit (INDEPENDENT_AMBULATORY_CARE_PROVIDER_SITE_OTHER): Payer: Commercial Managed Care - PPO | Admitting: Internal Medicine

## 2019-12-12 ENCOUNTER — Encounter: Payer: Self-pay | Admitting: Internal Medicine

## 2019-12-12 ENCOUNTER — Other Ambulatory Visit: Payer: Self-pay

## 2019-12-12 VITALS — BP 136/70 | HR 77 | Temp 97.1°F | Ht 67.5 in | Wt 221.8 lb

## 2019-12-12 DIAGNOSIS — U071 COVID-19: Secondary | ICD-10-CM | POA: Diagnosis not present

## 2019-12-12 DIAGNOSIS — J4520 Mild intermittent asthma, uncomplicated: Secondary | ICD-10-CM

## 2019-12-12 MED ORDER — FLUTICASONE PROPIONATE HFA 220 MCG/ACT IN AERO: 2.0000 | INHALATION_SPRAY | Freq: Two times a day (BID) | RESPIRATORY_TRACT | 6 refills | Status: DC

## 2019-12-12 NOTE — Patient Instructions (Signed)
AVOID TRIGGERS START FLOVENT HFA INHALER ALBUTEROL AS NEEDED AND PRIOR TO SINGING OBTAIN PFT's Check ONO and 6WMT

## 2019-12-12 NOTE — Progress Notes (Signed)
Name: Katie Salas MRN: [id number] DOB: Feb 13, 1965     CONSULTATION DATE:  REFERRING MD : Scifres  CHIEF COMPLAINT: COUGH  STUDIES:      CT chest Independently reviewed by Me 12/12/2019 Bilateral interstitial opacifications most commonly in the posterior aspect of both lobes of the lungs Findings reviewed with the patient thoroughly  HISTORY OF PRESENT ILLNESS: 54 year old pleasant white female seen today for signs symptoms of post COVID-19 infection She was diagnosed 11/03/2019 with symptoms of persistent fevers of 103 with body aches cough shortness of breath fatigue Patient did undergo monoclonal antibody infusion at [facility] Patient is here today to assess her lung function status with ongoing symptoms of shortness of breath cough and extreme fatigue  Patient is a non-smoker However has been exposed to secondhand smoke exposure for 19 years 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  has a past surgical history that includes Gastric bypass (2003); Cesarean section; Breast surgery; Neck surgery; and Abdominal hysterectomy (N/A, 11/19/2013). Prior to Admission medications   Medication Sig Start Date End Date Taking? Authorizing Provider  albuterol (VENTOLIN HFA) 108 (90 Base) MCG/ACT inhaler Inhale 1-2 puffs into the lungs every 6 (six) hours as needed for wheezing or shortness  of breath. 12/07/19  Yes Wurst, [place], PA-C  benzonatate (TESSALON) 100 MG capsule Take 1 capsule (100 mg total) by mouth every 8 (eight) hours. 12/07/19  Yes Wurst, [place], PA-C  predniSONE (STERAPRED UNI-PAK 21 TAB) 10 MG (21) TBPK tablet Take by mouth daily. Take 6 tabs by mouth daily  for 2 days, then 5 tabs for 2 days, then 4 tabs for 2 days, then 3 tabs for 2 days, 2 tabs for 2 days, then 1 tab by mouth daily for 2 days 12/07/19  Yes Wurst, [place], PA-C  ibuprofen (ADVIL,MOTRIN) 800 MG tablet Take 1 tablet (800 mg total) by mouth every 8 (eight) hours as needed for moderate pain (mild pain). 11/21/13   Molli Posey, MD   Allergies  Allergen Reactions  . Darvocet [Propoxyphene N-Acetaminophen] Shortness Of Breath    Does OK with percocet  . Adhesive [Tape] Other (See Comments)    blisters  . Sulfa Antibiotics Swelling    FAMILY HISTORY:  Family history is unknown by patient. SOCIAL HISTORY:  reports that she has never smoked. She has never used smokeless tobacco. She reports current alcohol use. She reports that she does not use drugs.    Review of Systems:  Gen:  Denies  fever, sweats, chills weigh loss  HEENT: Denies blurred vision, double vision, ear pain, eye pain, hearing loss, nose bleeds, sore throat Cardiac:  No dizziness, chest pain or heaviness, chest tightness,edema, No JVD Resp:   + cough, -sputum production, +shortness of breath,+wheezing, -hemoptysis,  Gi: Denies swallowing difficulty, stomach pain, nausea or vomiting, diarrhea, constipation, bowel incontinence Gu:  Denies bladder incontinence, burning urine Ext:   Denies Joint pain, stiffness or swelling Skin: Denies  skin rash, easy bruising or bleeding or hives Endoc:  Denies polyuria, polydipsia , polyphagia or weight change Psych:   Denies depression, insomnia or hallucinations  Other:  All other systems negative    BP 136/70 (BP Location: Left Arm, Cuff Size: Normal)   Pulse 77   Temp (!)  97.1 F (36.2 C) (Temporal)   Ht 5' 7.5" (1.715 m)   Wt 221 lb 12.8 oz (100.6 kg)   LMP 11/11/2013   SpO2 97%   BMI 34.23 kg/m     SpO2: 97 % O2 Device: None (Room air)    Physical Examination:   GENERAL:NAD, no fevers, chills, no weakness no fatigue HEAD: Normocephalic, atraumatic.  EYES: PERLA, EOMI No scleral icterus.  MOUTH: Moist mucosal membrane.  EAR, NOSE, THROAT: Clear without exudates. No external lesions.  NECK: Supple.  PULMONARY: CTA B/L no wheezing, rhonchi, crackles CARDIOVASCULAR: S1 and S2. Regular rate and rhythm. No murmurs GASTROINTESTINAL: Soft, nontender, nondistended. Positive bowel sounds.  MUSCULOSKELETAL: No swelling, clubbing, or edema.  NEUROLOGIC: No gross focal neurological deficits. 5/5 strength all extremities SKIN: No ulceration, lesions, rashes, or cyanosis.  PSYCHIATRIC: Insight, judgment intact. -depression -anxiety ALL OTHER ROS ARE NEGATIVE   MEDICATIONS: I have reviewed all medications and confirmed regimen as documented      ASSESSMENT AND PLAN SYNOPSIS 54 year old pleasant white female seen today for post Covid 19 infection and pneumonia with ongoing symptoms of cough shortness of breath and fatigue CT of the chest thoroughly reviewed with the patient and independently reviewed showing bilateral opacifications in the posterior aspects of the lungs indicative of recent COVID-19 infection suggesting ongoing inflammatory process with signs and symptoms of ongoing acute viral bronchitis with  reactive airways disease   Continue albuterol as needed and prior to sitting and exercise Avoid triggers Start Flovent 220 mcg 2 puffs twice daily Wear mask at work to avoid dust exposure  Shortness of breath and fatigue I will obtain pulmonary function testing to assess lung function Obtain 6-minute walk test to assess for exertional hypoxia Obtain overnight pulse oximetry to assess for nocturnal hypoxia     COVID-19 EDUCATION: The  signs and symptoms of COVID-19 were discussed with the patient and how to seek care for testing.  The importance of social distancing was discussed today. Hand Washing Techniques and avoid touching face was advised.     MEDICATION ADJUSTMENTS/LABS AND TESTS ORDERED: AVOID TRIGGERS START FLOVENT HFA INHALER ALBUTEROL AS NEEDED AND PRIOR TO SINGING OBTAIN PFT's Check ONO and 6WMT   CURRENT MEDICATIONS REVIEWED AT LENGTH WITH PATIENT TODAY   Patient satisfied with Plan of action and management. All questions answered  Follow up in 6 months  TOTAL TIME SPENT 47 minutes   Corrin Parker, M.D.  [facility]  Medical Director [place] Director [facility] Cardio-Pulmonary Department

## 2019-12-22 ENCOUNTER — Other Ambulatory Visit: Payer: Self-pay

## 2019-12-22 ENCOUNTER — Ambulatory Visit (INDEPENDENT_AMBULATORY_CARE_PROVIDER_SITE_OTHER): Payer: Commercial Managed Care - PPO

## 2019-12-22 DIAGNOSIS — R0600 Dyspnea, unspecified: Secondary | ICD-10-CM

## 2019-12-22 DIAGNOSIS — R0609 Other forms of dyspnea: Secondary | ICD-10-CM

## 2019-12-22 DIAGNOSIS — U071 COVID-19: Secondary | ICD-10-CM

## 2019-12-22 DIAGNOSIS — J4520 Mild intermittent asthma, uncomplicated: Secondary | ICD-10-CM

## 2019-12-23 ENCOUNTER — Encounter: Payer: Self-pay | Admitting: Internal Medicine

## 2019-12-25 ENCOUNTER — Telehealth: Payer: Self-pay

## 2019-12-25 DIAGNOSIS — G4734 Idiopathic sleep related nonobstructive alveolar hypoventilation: Secondary | ICD-10-CM

## 2019-12-25 NOTE — Telephone Encounter (Signed)
ONO reviewed by Dr. Mortimer Fries-- recommend 1L QHS. Patient is aware of results and voiced her understanding.  Order has been placed for 1L, as patient is agreeable.  Nothing further needed.

## 2020-05-14 ENCOUNTER — Telehealth: Payer: Self-pay

## 2020-05-14 NOTE — Telephone Encounter (Signed)
Spoke to patient about her upcoming Covid test on 05/18/20. Pt had an understanding.

## 2020-05-18 ENCOUNTER — Other Ambulatory Visit: Admission: RE | Admit: 2020-05-18 | Payer: Commercial Managed Care - PPO | Source: Ambulatory Visit

## 2020-05-19 ENCOUNTER — Ambulatory Visit: Payer: Commercial Managed Care - PPO

## 2020-06-23 ENCOUNTER — Encounter: Payer: Self-pay | Admitting: Oncology

## 2020-06-28 ENCOUNTER — Other Ambulatory Visit: Payer: Self-pay

## 2020-06-28 ENCOUNTER — Encounter (INDEPENDENT_AMBULATORY_CARE_PROVIDER_SITE_OTHER): Payer: Commercial Managed Care - PPO | Admitting: Ophthalmology

## 2020-06-28 DIAGNOSIS — H43813 Vitreous degeneration, bilateral: Secondary | ICD-10-CM

## 2020-06-28 DIAGNOSIS — B399 Histoplasmosis, unspecified: Secondary | ICD-10-CM | POA: Diagnosis not present

## 2020-06-28 DIAGNOSIS — H26491 Other secondary cataract, right eye: Secondary | ICD-10-CM | POA: Diagnosis not present

## 2020-06-28 DIAGNOSIS — H32 Chorioretinal disorders in diseases classified elsewhere: Secondary | ICD-10-CM

## 2020-06-28 DIAGNOSIS — H2512 Age-related nuclear cataract, left eye: Secondary | ICD-10-CM

## 2020-07-05 ENCOUNTER — Encounter: Payer: Self-pay | Admitting: Oncology

## 2020-07-21 ENCOUNTER — Other Ambulatory Visit: Payer: Self-pay

## 2020-07-21 ENCOUNTER — Encounter (INDEPENDENT_AMBULATORY_CARE_PROVIDER_SITE_OTHER): Payer: Managed Care, Other (non HMO) | Admitting: Ophthalmology

## 2020-07-21 DIAGNOSIS — H2701 Aphakia, right eye: Secondary | ICD-10-CM

## 2020-08-06 ENCOUNTER — Emergency Department (HOSPITAL_COMMUNITY): Payer: Commercial Managed Care - PPO

## 2020-08-06 ENCOUNTER — Emergency Department (HOSPITAL_COMMUNITY)
Admission: EM | Admit: 2020-08-06 | Discharge: 2020-08-06 | Disposition: A | Discharge status: Home / Self Care | Payer: Commercial Managed Care - PPO | Attending: Emergency Medicine | Admitting: Emergency Medicine

## 2020-08-06 ENCOUNTER — Encounter (HOSPITAL_COMMUNITY): Payer: Self-pay

## 2020-08-06 ENCOUNTER — Other Ambulatory Visit: Payer: Self-pay

## 2020-08-06 DIAGNOSIS — S93401A Sprain of unspecified ligament of right ankle, initial encounter: Secondary | ICD-10-CM

## 2020-08-06 DIAGNOSIS — S0083XA Contusion of other part of head, initial encounter: Secondary | ICD-10-CM | POA: Insufficient documentation

## 2020-08-06 DIAGNOSIS — Y990 Civilian activity done for income or pay: Secondary | ICD-10-CM | POA: Diagnosis not present

## 2020-08-06 DIAGNOSIS — I10 Essential (primary) hypertension: Secondary | ICD-10-CM | POA: Insufficient documentation

## 2020-08-06 DIAGNOSIS — W01198A Fall on same level from slipping, tripping and stumbling with subsequent striking against other object, initial encounter: Secondary | ICD-10-CM | POA: Diagnosis not present

## 2020-08-06 DIAGNOSIS — Z85238 Personal history of other malignant neoplasm of thymus: Secondary | ICD-10-CM | POA: Insufficient documentation

## 2020-08-06 DIAGNOSIS — Y92481 Parking lot as the place of occurrence of the external cause: Secondary | ICD-10-CM | POA: Insufficient documentation

## 2020-08-06 DIAGNOSIS — M25571 Pain in right ankle and joints of right foot: Secondary | ICD-10-CM | POA: Diagnosis not present

## 2020-08-06 DIAGNOSIS — S0990XA Unspecified injury of head, initial encounter: Secondary | ICD-10-CM | POA: Diagnosis present

## 2020-08-06 NOTE — ED Provider Notes (Signed)
[facility] DEPT Provider Note   CSN: [id number] Arrival date & time: 08/06/20  3419     History Chief Complaint  Patient presents with   Katie Salas is a 55 y.o. female.  55 year old female presents had mechanical fall just prior to arrival.  Katie Salas the right side of her head.  No LOC.  Does have hematoma to that site.  Denies any neck discomfort.  Also notes some pain to her right ankle.  Able to ambulate but with pain.  Denies any associated right knee or right hip pain.      Past Medical History:  Diagnosis Date   Anemia 2008   Gastric bypass status for obesity 03/27/2012   GERD (gastroesophageal reflux disease)    Hypertension    with pregnancy   PONV (postoperative nausea and vomiting)    Thymoma 03/27/2012    Patient Active Problem List   Diagnosis Date Noted   Leiomyoma 11/19/2013   Thymoma 03/27/2012   DJD (degenerative joint disease) of cervical spine 03/27/2012   Gastric bypass status for obesity 03/27/2012   Anemia, iron deficiency 12/12/2011    Past Surgical History:  Procedure Laterality Date   ABDOMINAL HYSTERECTOMY N/A 11/19/2013   Procedure: HYSTERECTOMY ABDOMINAL;  Surgeon: Margarette Asal, MD;  Location: [facility];  Service: Gynecology;  Laterality: N/A;   ANKLE SURGERY     ligament reconstruction when patient  was 55 years old   BREAST SURGERY     CESAREAN SECTION     GASTRIC BYPASS  01/23/2001   NECK SURGERY     cervical fusion     OB History   No obstetric history on file.     Family History  Problem Relation Age of Onset   Diabetes Mother    Liver disease Mother    Hypertension Father    Cancer Father    Heart attack Father     Social History   Tobacco Use   Smoking status: Never   Smokeless tobacco: Never  Vaping Use   Vaping Use: Never used  Substance Use Topics   Alcohol use: Yes    Alcohol/week: 0.0 standard drinks    Comment: rarely   Drug use: No    Home  Medications Prior to Admission medications   Medication Sig Start Date End Date Taking? Authorizing Provider  albuterol (VENTOLIN HFA) 108 (90 Base) MCG/ACT inhaler Inhale 1-2 puffs into the lungs every 6 (six) hours as needed for wheezing or shortness of breath. 12/07/19   Wurst, [place], PA-C  benzonatate (TESSALON) 100 MG capsule Take 1 capsule (100 mg total) by mouth every 8 (eight) hours. 12/07/19   Wurst, [place], PA-C  fluticasone (FLOVENT HFA) 220 MCG/ACT inhaler Inhale 2 puffs into the lungs 2 (two) times daily. 12/12/19 12/11/20  Flora Lipps, MD  ibuprofen (ADVIL,MOTRIN) 800 MG tablet Take 1 tablet (800 mg total) by mouth every 8 (eight) hours as needed for moderate pain (mild pain). 11/21/13   Molli Posey, MD  predniSONE (STERAPRED UNI-PAK 21 TAB) 10 MG (21) TBPK tablet Take by mouth daily. Take 6 tabs by mouth daily  for 2 days, then 5 tabs for 2 days, then 4 tabs for 2 days, then 3 tabs for 2 days, 2 tabs for 2 days, then 1 tab by mouth daily for 2 days 12/07/19   Stacey Drain, [place], PA-C    Allergies    Darvocet [propoxyphene n-acetaminophen], Adhesive [tape], and Sulfa antibiotics  Review of Systems  Review of Systems  All other systems reviewed and are negative.  Physical Exam Updated Vital Signs BP (!) 143/101 (BP Location: Right Arm)   Pulse 72   Temp 98.3 F (36.8 C) (Oral)   Resp 16   Ht 1.702 m (5\' 7" )   Wt 95.3 kg   LMP 11/11/2013   SpO2 97%   BMI 32.89 kg/m   Physical Exam Vitals and nursing note reviewed.  Constitutional:      General: She is not in acute distress.    Appearance: Normal appearance. She is well-developed. She is not toxic-appearing.  HENT:     Head:   Eyes:     General: Lids are normal.     Conjunctiva/sclera: Conjunctivae normal.     Pupils: Pupils are equal, round, and reactive to light.  Neck:     Thyroid: No thyroid mass.     Trachea: No tracheal deviation.  Cardiovascular:     Rate and Rhythm: Normal rate and regular  rhythm.     Heart sounds: Normal heart sounds. No murmur heard.   No gallop.  Pulmonary:     Effort: Pulmonary effort is normal. No respiratory distress.     Breath sounds: Normal breath sounds. No stridor. No decreased breath sounds, wheezing, rhonchi or rales.  Abdominal:     General: There is no distension.     Palpations: Abdomen is soft.     Tenderness: There is no abdominal tenderness. There is no rebound.  Musculoskeletal:        General: No tenderness. Normal range of motion.     Cervical back: Normal range of motion and neck supple.     Right ankle: Swelling present. No deformity.  Skin:    General: Skin is warm and dry.     Findings: No abrasion or rash.  Neurological:     Mental Status: She is alert and oriented to person, place, and time. Mental status is at baseline.     GCS: GCS eye subscore is 4. GCS verbal subscore is 5. GCS motor subscore is 6.     Cranial Nerves: Cranial nerves are intact. No cranial nerve deficit.     Sensory: No sensory deficit.     Motor: Motor function is intact.  Psychiatric:        Attention and Perception: Attention normal.        Speech: Speech normal.        Behavior: Behavior normal.    ED Results / Procedures / Treatments   Labs (all labs ordered are listed, but only abnormal results are displayed) Labs Reviewed - No data to display  EKG None  Radiology DG Ankle Complete Right  Result Date: 08/06/2020 CLINICAL DATA:  Right ankle injury, fall EXAM: RIGHT ANKLE - COMPLETE 3+ VIEW COMPARISON:  None. FINDINGS: Plantar calcaneal spur. Irregularity at the tip of both the medial and lateral malleoli could reflect avulsion fractures, age indeterminate. Joint spaces are maintained. IMPRESSION: Irregularity at the tip of both the medial and lateral malleoli could reflect avulsion fractures, age indeterminate. Electronically Signed   By: Rolm Baptise M.D.   On: 08/06/2020 09:21    Procedures Procedures   Medications Ordered in  ED Medications - No data to display  ED Course  I have reviewed the triage vital signs and the nursing notes.  Pertinent labs & imaging results that were available during my care of the patient were reviewed by me and considered in my medical decision making (see chart for  details).    MDM Rules/Calculators/A&P                          No indication for intracranial imaging at this time.  She did not have any loss of consciousness.  She does not take blood thinners.  She has not had any nausea or vomiting.  X-ray of right ankle noted with questionable chip fractures of her medial and lateral malleolus.  Will place in ankle stirrup splint and she will be referred to her orthopedic Final Clinical Impression(s) / ED Diagnoses Final diagnoses:  None    Rx / DC Orders ED Discharge Orders     None        Lacretia Leigh, MD 08/06/20 [phone]

## 2020-08-06 NOTE — Discharge Instructions (Addendum)
You may have 2 small chip fractures to both sides of your ankle.  Call your orthopedist to schedule a follow-up visit

## 2020-08-06 NOTE — ED Triage Notes (Signed)
Per EMS- Patient was in the parking deck at work and twisted her right ankle. Patient fell, hitting the right side of her head. Patient has a hematoma to the right temple area. No LOC. No blood thinners.

## 2020-08-25 ENCOUNTER — Telehealth: Payer: Self-pay | Admitting: Internal Medicine

## 2020-08-25 NOTE — Telephone Encounter (Signed)
Received a new hem referral from [facility]. Katie Salas has been cld and scheduled to see Dr. Julien Nordmann on 8/16 at 11:45am w/labs at 11:15am.

## 2020-09-07 ENCOUNTER — Other Ambulatory Visit: Payer: Self-pay | Admitting: Internal Medicine

## 2020-09-07 ENCOUNTER — Encounter: Payer: Self-pay | Admitting: Internal Medicine

## 2020-09-07 ENCOUNTER — Other Ambulatory Visit: Payer: Self-pay

## 2020-09-07 ENCOUNTER — Inpatient Hospital Stay (HOSPITAL_BASED_OUTPATIENT_CLINIC_OR_DEPARTMENT_OTHER): Payer: Commercial Managed Care - PPO | Admitting: Internal Medicine

## 2020-09-07 ENCOUNTER — Inpatient Hospital Stay: Payer: Commercial Managed Care - PPO | Attending: Internal Medicine

## 2020-09-07 VITALS — BP 144/74 | HR 53 | Temp 97.0°F | Resp 18 | Ht 67.0 in | Wt 219.2 lb

## 2020-09-07 DIAGNOSIS — D539 Nutritional anemia, unspecified: Secondary | ICD-10-CM

## 2020-09-07 DIAGNOSIS — E538 Deficiency of other specified B group vitamins: Secondary | ICD-10-CM | POA: Diagnosis present

## 2020-09-07 DIAGNOSIS — K909 Intestinal malabsorption, unspecified: Secondary | ICD-10-CM | POA: Diagnosis present

## 2020-09-07 DIAGNOSIS — Z9884 Bariatric surgery status: Secondary | ICD-10-CM

## 2020-09-07 DIAGNOSIS — D531 Other megaloblastic anemias, not elsewhere classified: Secondary | ICD-10-CM | POA: Insufficient documentation

## 2020-09-07 DIAGNOSIS — Z79899 Other long term (current) drug therapy: Secondary | ICD-10-CM | POA: Insufficient documentation

## 2020-09-07 DIAGNOSIS — D508 Other iron deficiency anemias: Secondary | ICD-10-CM | POA: Diagnosis present

## 2020-09-07 DIAGNOSIS — Z90710 Acquired absence of both cervix and uterus: Secondary | ICD-10-CM | POA: Diagnosis not present

## 2020-09-07 DIAGNOSIS — I10 Essential (primary) hypertension: Secondary | ICD-10-CM | POA: Diagnosis not present

## 2020-09-07 LAB — CBC WITH DIFFERENTIAL (CANCER CENTER ONLY)
Abs Immature Granulocytes: 0.01 10*3/uL (ref 0.00–0.07)
Basophils Absolute: 0 10*3/uL (ref 0.0–0.1)
Basophils Relative: 1 %
Eosinophils Absolute: 0.1 10*3/uL (ref 0.0–0.5)
Eosinophils Relative: 2 %
HCT: 34.3 % — ABNORMAL LOW (ref 36.0–46.0)
Hemoglobin: 10.5 g/dL — ABNORMAL LOW (ref 12.0–15.0)
Immature Granulocytes: 0 %
Lymphocytes Relative: 30 %
Lymphs Abs: 1.3 10*3/uL (ref 0.7–4.0)
MCH: 24.5 pg — ABNORMAL LOW (ref 26.0–34.0)
MCHC: 30.6 g/dL (ref 30.0–36.0)
MCV: 80 fL (ref 80.0–100.0)
Monocytes Absolute: 0.3 10*3/uL (ref 0.1–1.0)
Monocytes Relative: 6 %
Neutro Abs: 2.6 10*3/uL (ref 1.7–7.7)
Neutrophils Relative %: 61 %
Platelet Count: 247 10*3/uL (ref 150–400)
RBC: 4.29 MIL/uL (ref 3.87–5.11)
RDW: 14.7 % (ref 11.5–15.5)
WBC Count: 4.3 10*3/uL (ref 4.0–10.5)
nRBC: 0 % (ref 0.0–0.2)

## 2020-09-07 LAB — RETICULOCYTES
Immature Retic Fract: 14.9 % (ref 2.3–15.9)
RBC.: 4.19 MIL/uL (ref 3.87–5.11)
Retic Count, Absolute: 45.3 10*3/uL (ref 19.0–186.0)
Retic Ct Pct: 1.1 % (ref 0.4–3.1)

## 2020-09-07 LAB — CMP (CANCER CENTER ONLY)
ALT: 28 U/L (ref 0–44)
AST: 30 U/L (ref 15–41)
Albumin: 3.4 g/dL — ABNORMAL LOW (ref 3.5–5.0)
Alkaline Phosphatase: 105 U/L (ref 38–126)
Anion gap: 6 (ref 5–15)
BUN: 10 mg/dL (ref 6–20)
CO2: 26 mmol/L (ref 22–32)
Calcium: 8.5 mg/dL — ABNORMAL LOW (ref 8.9–10.3)
Chloride: 109 mmol/L (ref 98–111)
Creatinine: 0.71 mg/dL (ref 0.44–1.00)
GFR, Estimated: >60 mL/min (ref >60)
Glucose, Bld: 82 mg/dL (ref 70–99)
Potassium: 4.1 mmol/L (ref 3.5–5.1)
Sodium: 141 mmol/L (ref 135–145)
Total Bilirubin: 1 mg/dL (ref 0.3–1.2)
Total Protein: 6.1 g/dL — ABNORMAL LOW (ref 6.5–8.1)

## 2020-09-07 LAB — LACTATE DEHYDROGENASE: LDH: 180 U/L (ref 98–192)

## 2020-09-07 LAB — FERRITIN: Ferritin: 8 ng/mL — ABNORMAL LOW (ref 11–307)

## 2020-09-07 LAB — FOLATE: Folate: 5.5 ng/mL — ABNORMAL LOW (ref >5.9)

## 2020-09-07 LAB — IRON AND TIBC
Iron: 43 ug/dL (ref 41–142)
Saturation Ratios: 10 % — ABNORMAL LOW (ref 21–57)
TIBC: 428 ug/dL (ref 236–444)
UIBC: 385 ug/dL — ABNORMAL HIGH (ref 120–384)

## 2020-09-07 LAB — VITAMIN B12: Vitamin B-12: 142 pg/mL — ABNORMAL LOW (ref 180–914)

## 2020-09-07 MED ORDER — FOLIC ACID 1 MG PO TABS: 1.0000 mg | ORAL_TABLET | Freq: Every day | ORAL | 4 refills | Status: DC

## 2020-09-07 NOTE — Progress Notes (Signed)
[facility] [phone]   [phone]  CONSULT NOTE  REFERRING PHYSICIAN: Achille Rich, PA-C  REASON FOR CONSULTATION:  55 years old white female with persistent anemia.  HPI Katie Salas is a 55 y.o. female with past medical history significant for anemia for several years status post gastric bypass surgery in 2003, GERD, thymoma status postresection by Dr. Roxan Hockey, breast surgery after the gastric bypass.  The patient mentioned that she had anemia for several years even before she had the gastric bypass surgery but it got worse after the bypass surgery because of malabsorption issues. She was followed by Dr. Beryle Beams for several years initially at the cancer center then with the internal medicine practice.  She has received iron infusion in the past at regular basis usually 1 or twice a year.  She has been doing fine but the last iron infusion was several years ago before the COVID-19 pandemic.  Over the last several weeks the patient started having more symptoms and increasingly tired with dizzy spells as well as vertigo and nausea.  She also has been crushing ice regularly.  She was seen by her primary care provider and repeat CBC and iron study showed significant iron deficiency anemia.  Her ferritin level was down to 6.5 and hemoglobin 11.2.  The patient was referred to me today for evaluation and recommendation regarding her condition. When seen today the patient continues to complain of the above symptoms.  The patient never had a colonoscopy. Family history significant for mother with diabetes mellitus and autoimmune liver disease.  Father had hypertension and sarcoma. The patient is married and has 2 sons.  She works as a Radio broadcast assistant.  She has no history of smoking, alcohol or drug abuse.  HPI  Past Medical History:  Diagnosis Date   Anemia 2008   Gastric bypass status for obesity 03/27/2012   GERD (gastroesophageal reflux disease)     Hypertension    with pregnancy   PONV (postoperative nausea and vomiting)    Thymoma 03/27/2012    Past Surgical History:  Procedure Laterality Date   ABDOMINAL HYSTERECTOMY N/A 11/19/2013   Procedure: HYSTERECTOMY ABDOMINAL;  Surgeon: Margarette Asal, MD;  Location: [facility];  Service: Gynecology;  Laterality: N/A;   ANKLE SURGERY     ligament reconstruction when patient  was 55 years old   BREAST SURGERY     CESAREAN SECTION     GASTRIC BYPASS  01/23/2001   NECK SURGERY     cervical fusion    Family History  Problem Relation Age of Onset   Diabetes Mother    Liver disease Mother    Hypertension Father    Cancer Father    Heart attack Father     Social History Social History   Tobacco Use   Smoking status: Never   Smokeless tobacco: Never  Vaping Use   Vaping Use: Never used  Substance Use Topics   Alcohol use: Yes    Alcohol/week: 0.0 standard drinks    Comment: rarely   Drug use: No    Allergies  Allergen Reactions   Darvocet [Propoxyphene N-Acetaminophen] Shortness Of Breath    Does OK with percocet   Adhesive [Tape] Other (See Comments)    blisters   Sulfa Antibiotics Swelling    Current Outpatient Medications  Medication Sig Dispense Refill   albuterol (VENTOLIN HFA) 108 (90 Base) MCG/ACT inhaler Inhale 1-2 puffs into the lungs every 6 (six) hours as needed for wheezing  or shortness of breath. (Patient not taking: Reported on 09/07/2020) 18 g 0   Cholecalciferol (VITAMIN D3) 1.25 MG (50000 UT) CAPS Vitamin D 5,000 unit tablet  Take by oral route.     No current facility-administered medications for this visit.    Review of Systems  Constitutional: positive for fatigue Eyes: negative Ears, nose, mouth, throat, and face: negative Respiratory: positive for dyspnea on exertion Cardiovascular: negative Gastrointestinal: positive for dyspepsia Genitourinary:negative Integument/breast: negative Hematologic/lymphatic:  negative Musculoskeletal:negative Neurological: negative Behavioral/Psych: negative Endocrine: negative Allergic/Immunologic: negative  Physical Exam  [id number], healthy, no distress, well nourished, and well developed SKIN: skin color, texture, turgor are normal, no rashes or significant lesions HEAD: Normocephalic, No masses, lesions, tenderness or abnormalities EYES: normal, PERRLA, Conjunctiva are pink and non-injected EARS: External ears normal, Canals clear OROPHARYNX:no exudate, no erythema, and lips, buccal mucosa, and tongue normal  NECK: supple, no adenopathy, no JVD LYMPH:  no palpable lymphadenopathy, no hepatosplenomegaly BREAST:not examined LUNGS: clear to auscultation , and palpation HEART: regular rate & rhythm, no murmurs, and no gallops ABDOMEN:abdomen soft, non-tender, normal bowel sounds, and no masses or organomegaly BACK: No CVA tenderness, Range of motion is normal EXTREMITIES:no joint deformities, effusion, or inflammation, no edema  NEURO: no focal motor/sensory deficits  PERFORMANCE STATUS: ECOG 1  LABORATORY DATA: Lab Results  Component Value Date   WBC 4.3 09/07/2020   HGB 10.5 (L) 09/07/2020   HCT 34.3 (L) 09/07/2020   MCV 80.0 09/07/2020   PLT 247 09/07/2020      Chemistry      Component Value Date/Time   NA 141 09/07/2020 1108   NA 141 03/16/2015 0945   NA 140 11/27/2011 1515   K 4.1 09/07/2020 1108   K 3.8 11/27/2011 1515   CL 109 09/07/2020 1108   CL 109 (H) 11/27/2011 1515   CO2 26 09/07/2020 1108   CO2 27 11/27/2011 1515   BUN 10 09/07/2020 1108   BUN 12 03/16/2015 0945   BUN 14.0 11/27/2011 1515   CREATININE 0.71 09/07/2020 1108   CREATININE 0.6 11/27/2011 1515      Component Value Date/Time   CALCIUM 8.5 (L) 09/07/2020 1108   CALCIUM 8.5 11/27/2011 1515   ALKPHOS 105 09/07/2020 1108   ALKPHOS 160 (H) 11/27/2011 1515   AST 30 09/07/2020 1108   AST 28 11/27/2011 1515   ALT 28 09/07/2020 1108   ALT 25 11/27/2011 1515    BILITOT 1.0 09/07/2020 1108   BILITOT 0.81 11/27/2011 1515       RADIOGRAPHIC STUDIES: No results found.  ASSESSMENT: This is a very pleasant 55 years old white female with history of persistent anemia secondary to malabsorption secondary to gastric bypass surgery in 2003 and the patient has been on treatment with iron infusion in the past.  Her last iron infusion was 3 years ago before the [place] pandemic.  She was followed by Dr. Charlestine Massed before his retirement.   PLAN: I had a lengthy discussion with the patient today about her current condition and treatment options.  I order several studies today for evaluation of her anemia including repeat CBC which showed hemoglobin of 10.5 and hematocrit 34.3% with MCV of 80.  Ferritin level was 8.  Serum iron was low normal at 43 with iron saturation of 10%. The patient also has low serum folate of 5.5 and low vitamin B12 level of 142. I recommended for the patient to proceed with iron infusion with Feraheme 510 Mg IV weekly for 2 weeks. I  will also arrange for the patient to receive weekly vitamin B12 injection at 1000 mcg intramuscular then monthly after the first 4 weeks.  She can continue with the vitamin B12 injection with her primary care provider after the first 4 weeks. I also send prescription for folic acid 1 mg p.o. daily to her pharmacy. I will see the patient back for follow-up visit in 6 months for evaluation and repeat CBC, iron study and ferritin. She was advised to call immediately if she has any other concerning symptoms in the interval. The patient voices understanding of current disease status and treatment options and is in agreement with the current care plan.  All questions were answered. The patient knows to call the clinic with any problems, questions or concerns. We can certainly see the patient much sooner if necessary.  Thank you so much for allowing me to participate in the care of Katie Salas. I will continue to  follow up the patient with you and assist in her care.  The total time spent in the appointment was 60 minutes.  Disclaimer: This note was dictated with voice recognition software. Similar sounding words can inadvertently be transcribed and may not be corrected upon review.   Eilleen Kempf September 07, 2020, 12:17 PM

## 2020-09-08 ENCOUNTER — Telehealth: Payer: Self-pay

## 2020-09-08 LAB — ERYTHROPOIETIN: Erythropoietin: 27.1 m[IU]/mL — ABNORMAL HIGH (ref 2.6–18.5)

## 2020-09-08 NOTE — Telephone Encounter (Signed)
I spoke with pt and advised as indicated. Pt expressed understanding of this information. 

## 2020-09-08 NOTE — Telephone Encounter (Signed)
-----   Message from Curt Bears, MD sent at 09/07/2020  3:38 PM EDT ----- Please let the patient know that her folic acid and vitamin B12 are also low.  I will arrange for her to receive vitamin B12 injection weekly for the next 4 weeks in the clinic.  She will need to start folic acid daily and I will send prescription to her pharmacy.  Thank you. ----- Message ----- From: Buel Ream, [facility] [place] Sent: 09/07/2020  11:24 AM EDT To: Curt Bears, MD

## 2020-09-11 ENCOUNTER — Inpatient Hospital Stay: Payer: Commercial Managed Care - PPO

## 2020-09-11 ENCOUNTER — Other Ambulatory Visit: Payer: Self-pay

## 2020-09-11 VITALS — BP 145/65 | HR 57 | Temp 97.9°F | Resp 18 | Ht 67.0 in

## 2020-09-11 DIAGNOSIS — D508 Other iron deficiency anemias: Secondary | ICD-10-CM | POA: Diagnosis not present

## 2020-09-11 MED ORDER — ACETAMINOPHEN 325 MG PO TABS
650.0000 mg | ORAL_TABLET | Freq: Once | ORAL | Status: AC
  Administered 2020-09-11: 650 mg via ORAL

## 2020-09-11 MED ORDER — DIPHENHYDRAMINE HCL 50 MG/ML IJ SOLN
25.0000 mg | Freq: Once | INTRAMUSCULAR | Status: AC
Start: 2020-09-11 — End: 2020-09-11
  Administered 2020-09-11: 25 mg via INTRAVENOUS
  Filled 2020-09-11: qty 1

## 2020-09-11 MED ORDER — SODIUM CHLORIDE 0.9 % IV SOLN
510.0000 mg | Freq: Once | INTRAVENOUS | Status: AC
  Administered 2020-09-11: 510 mg via INTRAVENOUS
  Filled 2020-09-11: qty 510

## 2020-09-11 MED ORDER — SODIUM CHLORIDE 0.9 % IV SOLN: Freq: Once | INTRAVENOUS | Status: AC

## 2020-09-11 MED ORDER — ACETAMINOPHEN 325 MG PO TABS
ORAL_TABLET | ORAL | Status: AC
  Filled 2020-09-11: qty 2

## 2020-09-11 MED ORDER — CYANOCOBALAMIN 1000 MCG/ML IJ SOLN
INTRAMUSCULAR | Status: AC
  Filled 2020-09-11: qty 1

## 2020-09-11 MED ORDER — CYANOCOBALAMIN 1000 MCG/ML IJ SOLN
1000.0000 ug | Freq: Once | INTRAMUSCULAR | Status: AC
  Administered 2020-09-11: 1000 ug via INTRAMUSCULAR

## 2020-09-11 NOTE — Patient Instructions (Signed)
Ferumoxytol injection What is this medication? FERUMOXYTOL is an iron complex. Iron is used to make healthy red blood cells, which carry oxygen and nutrients throughout the body. This medicine is used totreat iron deficiency anemia. This medicine may be used for other purposes; ask your health care provider orpharmacist if you have questions. COMMON BRAND NAME(S): Feraheme What should I tell my care team before I take this medication? They need to know if you have any of these conditions: anemia not caused by low iron levels high levels of iron in the blood magnetic resonance imaging (MRI) test scheduled an unusual or allergic reaction to iron, other medicines, foods, dyes, or preservatives pregnant or trying to get pregnant breast-feeding How should I use this medication? This medicine is for injection into a vein. It is given by a health careprofessional in a hospital or clinic setting. Talk to your pediatrician regarding the use of this medicine in children.Special care may be needed. Overdosage: If you think you have taken too much of this medicine contact apoison control center or emergency room at once. NOTE: This medicine is only for you. Do not share this medicine with others. What if I miss a dose? It is important not to miss your dose. Call your doctor or health careprofessional if you are unable to keep an appointment. What may interact with this medication? This medicine may interact with the following medications: other iron products This list may not describe all possible interactions. Give your health care provider a list of all the medicines, herbs, non-prescription drugs, or dietary supplements you use. Also tell them if you smoke, drink alcohol, or use illegaldrugs. Some items may interact with your medicine. What should I watch for while using this medication? Visit your doctor or healthcare professional regularly. Tell your doctor or healthcare professional if your  symptoms do not start to get better or if theyget worse. You may need blood work done while you are taking this medicine. You may need to follow a special diet. Talk to your doctor. Foods that contain iron include: whole grains/cereals, dried fruits, beans, or peas, leafy greenvegetables, and organ meats (liver, kidney). What side effects may I notice from receiving this medication? Side effects that you should report to your doctor or health care professionalas soon as possible: allergic reactions like skin rash, itching or hives, swelling of the face, lips, or tongue breathing problems changes in blood pressure feeling faint or lightheaded, falls fever or chills flushing, sweating, or hot feelings swelling of the ankles or feet Side effects that usually do not require medical attention (report to yourdoctor or health care professional if they continue or are bothersome): diarrhea headache nausea, vomiting stomach pain This list may not describe all possible side effects. Call your doctor for medical advice about side effects. You may report side effects to FDA at1-800-FDA-1088. Where should I keep my medication? This drug is given in a hospital or clinic and will not be stored at home. NOTE: This sheet is a summary. It may not cover all possible information. If you have questions about this medicine, talk to your doctor, pharmacist, orhealth care provider.  2022 Elsevier/Gold Standard (2016-02-28 20:21:10)  

## 2020-09-20 LAB — PROTEIN ELECTROPHORESIS, SERUM, WITH REFLEX
A/G Ratio: 1.2 (ref 0.7–1.7)
Albumin ELP: 3.1 g/dL (ref 2.9–4.4)
Alpha-1-Globulin: 0.2 g/dL (ref 0.0–0.4)
Alpha-2-Globulin: 0.6 g/dL (ref 0.4–1.0)
Beta Globulin: 0.9 g/dL (ref 0.7–1.3)
Gamma Globulin: 0.8 g/dL (ref 0.4–1.8)
Globulin, Total: 2.5 g/dL (ref 2.2–3.9)
Total Protein ELP: 5.6 g/dL — ABNORMAL LOW (ref 6.0–8.5)

## 2020-09-25 ENCOUNTER — Inpatient Hospital Stay: Payer: Commercial Managed Care - PPO | Attending: Internal Medicine

## 2020-09-25 ENCOUNTER — Other Ambulatory Visit: Payer: Self-pay

## 2020-09-25 VITALS — BP 103/71 | HR 53 | Temp 97.4°F | Resp 18

## 2020-09-25 DIAGNOSIS — Z9884 Bariatric surgery status: Secondary | ICD-10-CM | POA: Insufficient documentation

## 2020-09-25 DIAGNOSIS — E538 Deficiency of other specified B group vitamins: Secondary | ICD-10-CM | POA: Insufficient documentation

## 2020-09-25 DIAGNOSIS — K912 Postsurgical malabsorption, not elsewhere classified: Secondary | ICD-10-CM | POA: Diagnosis not present

## 2020-09-25 DIAGNOSIS — D508 Other iron deficiency anemias: Secondary | ICD-10-CM | POA: Insufficient documentation

## 2020-09-25 MED ORDER — FERUMOXYTOL INJECTION 510 MG/17 ML
510.0000 mg | Freq: Once | INTRAVENOUS | Status: AC
  Administered 2020-09-25: 510 mg via INTRAVENOUS
  Filled 2020-09-25: qty 510

## 2020-09-25 MED ORDER — ACETAMINOPHEN 325 MG PO TABS: 650.0000 mg | ORAL_TABLET | Freq: Once | ORAL | Status: AC

## 2020-09-25 MED ORDER — CYANOCOBALAMIN 1000 MCG/ML IJ SOLN
INTRAMUSCULAR | Status: AC
  Administered 2020-09-25: 1000 ug via INTRAMUSCULAR
  Filled 2020-09-25: qty 1

## 2020-09-25 MED ORDER — DIPHENHYDRAMINE HCL 50 MG/ML IJ SOLN: 25.0000 mg | Freq: Once | INTRAMUSCULAR | Status: DC

## 2020-09-25 MED ORDER — ACETAMINOPHEN 325 MG PO TABS
ORAL_TABLET | ORAL | Status: AC
  Administered 2020-09-25: 650 mg via ORAL
  Filled 2020-09-25: qty 2

## 2020-09-25 MED ORDER — CYANOCOBALAMIN 1000 MCG/ML IJ SOLN: 1000.0000 ug | Freq: Once | INTRAMUSCULAR | Status: AC

## 2020-09-25 MED ORDER — SODIUM CHLORIDE 0.9 % IV SOLN: Freq: Once | INTRAVENOUS | Status: AC

## 2020-09-25 MED ORDER — DIPHENHYDRAMINE HCL 25 MG PO CAPS
ORAL_CAPSULE | ORAL | Status: AC
  Administered 2020-09-25: 25 mg
  Filled 2020-09-25: qty 1

## 2020-09-25 NOTE — Patient Instructions (Signed)
Ferumoxytol Injection What is this medication? FERUMOXYTOL (FER ue MOX i tol) treats low levels of iron in your body (iron deficiency anemia). Iron is a mineral that plays an important role in making red blood cells, which carry oxygen from your lungs to the rest of your body. This medicine may be used for other purposes; ask your health care provider or pharmacist if you have questions. COMMON BRAND NAME(S): Feraheme What should I tell my care team before I take this medication? They need to know if you have any of these conditions: Anemia not caused by low iron levels High levels of iron in the blood Magnetic resonance imaging (MRI) test scheduled An unusual or allergic reaction to iron, other medications, foods, dyes, or preservatives Pregnant or trying to get pregnant Breast-feeding How should I use this medication? This medication is for injection into a vein. It is given in a hospital or clinic setting. Talk to your care team the use of this medication in children. Special care may be needed. Overdosage: If you think you have taken too much of this medicine contact a poison control center or emergency room at once. NOTE: This medicine is only for you. Do not share this medicine with others. What if I miss a dose? It is important not to miss your dose. Call your care team if you are unable to keep an appointment. What may interact with this medication? Other iron products This list may not describe all possible interactions. Give your health care provider a list of all the medicines, herbs, non-prescription drugs, or dietary supplements you use. Also tell them if you smoke, drink alcohol, or use illegal drugs. Some items may interact with your medicine. What should I watch for while using this medication? Visit your care team regularly. Tell your care team if your symptoms do not start to get better or if they get worse. You may need blood work done while you are taking this  medication. You may need to follow a special diet. Talk to your care team. Foods that contain iron include: whole grains/cereals, dried fruits, beans, or peas, leafy green vegetables, and organ meats (liver, kidney). What side effects may I notice from receiving this medication? Side effects that you should report to your care team as soon as possible: Allergic reactions-skin rash, itching, hives, swelling of the face, lips, tongue, or throat Low blood pressure-dizziness, feeling faint or lightheaded, blurry vision Shortness of breath Side effects that usually do not require medical attention (report to your care team if they continue or are bothersome): Flushing Headache Joint pain Muscle pain Nausea Pain, redness, or irritation at injection site This list may not describe all possible side effects. Call your doctor for medical advice about side effects. You may report side effects to FDA at 1-800-FDA-1088. Where should I keep my medication? This medication is given in a hospital or clinic and will not be stored at home. NOTE: This sheet is a summary. It may not cover all possible information. If you have questions about this medicine, talk to your doctor, pharmacist, or health care provider.  2022 Elsevier/Gold Standard (2020-05-28 15:35:12)  

## 2021-03-09 ENCOUNTER — Other Ambulatory Visit: Payer: Self-pay

## 2021-03-09 DIAGNOSIS — D508 Other iron deficiency anemias: Secondary | ICD-10-CM

## 2021-03-10 ENCOUNTER — Inpatient Hospital Stay: Payer: Commercial Managed Care - PPO | Attending: Internal Medicine | Admitting: Internal Medicine

## 2021-03-10 ENCOUNTER — Inpatient Hospital Stay: Payer: Commercial Managed Care - PPO

## 2021-03-10 ENCOUNTER — Other Ambulatory Visit: Payer: Self-pay

## 2021-03-10 ENCOUNTER — Encounter: Payer: Self-pay | Admitting: Internal Medicine

## 2021-03-10 VITALS — BP 142/76 | HR 68 | Temp 97.6°F | Resp 17 | Ht 67.0 in | Wt 223.3 lb

## 2021-03-10 DIAGNOSIS — D531 Other megaloblastic anemias, not elsewhere classified: Secondary | ICD-10-CM

## 2021-03-10 DIAGNOSIS — E538 Deficiency of other specified B group vitamins: Secondary | ICD-10-CM | POA: Diagnosis not present

## 2021-03-10 DIAGNOSIS — D508 Other iron deficiency anemias: Secondary | ICD-10-CM | POA: Diagnosis not present

## 2021-03-10 DIAGNOSIS — D509 Iron deficiency anemia, unspecified: Secondary | ICD-10-CM | POA: Diagnosis not present

## 2021-03-10 LAB — CBC WITH DIFFERENTIAL (CANCER CENTER ONLY)
Abs Immature Granulocytes: 0.01 10*3/uL (ref 0.00–0.07)
Basophils Absolute: 0 10*3/uL (ref 0.0–0.1)
Basophils Relative: 1 %
Eosinophils Absolute: 0.1 10*3/uL (ref 0.0–0.5)
Eosinophils Relative: 2 %
HCT: 38.6 % (ref 36.0–46.0)
Hemoglobin: 12.7 g/dL (ref 12.0–15.0)
Immature Granulocytes: 0 %
Lymphocytes Relative: 24 %
Lymphs Abs: 1.1 10*3/uL (ref 0.7–4.0)
MCH: 29 pg (ref 26.0–34.0)
MCHC: 32.9 g/dL (ref 30.0–36.0)
MCV: 88.1 fL (ref 80.0–100.0)
Monocytes Absolute: 0.3 10*3/uL (ref 0.1–1.0)
Monocytes Relative: 6 %
Neutro Abs: 3.2 10*3/uL (ref 1.7–7.7)
Neutrophils Relative %: 67 %
Platelet Count: 218 10*3/uL (ref 150–400)
RBC: 4.38 MIL/uL (ref 3.87–5.11)
RDW: 12.6 % (ref 11.5–15.5)
WBC Count: 4.7 10*3/uL (ref 4.0–10.5)
nRBC: 0 % (ref 0.0–0.2)

## 2021-03-10 LAB — FERRITIN: Ferritin: 54 ng/mL (ref 11–307)

## 2021-03-10 LAB — IRON AND IRON BINDING CAPACITY (CC-WL,HP ONLY)
Iron: 88 ug/dL (ref 28–170)
Saturation Ratios: 27 % (ref 10.4–31.8)
TIBC: 325 ug/dL (ref 250–450)
UIBC: 237 ug/dL (ref 148–442)

## 2021-03-10 MED ORDER — CYANOCOBALAMIN 1000 MCG/ML IJ SOLN
1000.0000 ug | Freq: Once | INTRAMUSCULAR | Status: AC
  Administered 2021-03-10: 1000 ug via INTRAMUSCULAR
  Filled 2021-03-10: qty 1

## 2021-03-10 NOTE — Progress Notes (Signed)
[facility] [phone]   [phone]  OFFICE PROGRESS NOTE  Scifres, Earlie Server, PA-C [place] [address]  DIAGNOSIS: history of persistent anemia secondary to malabsorption secondary to gastric bypass surgery in 2003  PRIOR THERAPY:None.  CURRENT THERAPY: 1) Feraheme infusion 510 Mg IV weekly for 2 weeks. 2) vitamin B12 injection weekly for 4 weeks followed by monthly injection.  INTERVAL HISTORY: Katie Salas 56 y.o. female returns to the clinic today for follow-up visit.  The patient is feeling much better today after receiving iron infusion as well as vitamin B12 injection.  She denied having any chest pain, shortness of breath, cough or hemoptysis.  She denied having any fever or chills.  She has no nausea, vomiting, diarrhea or constipation.  She has no headache or visual changes.  She denied having any recent weight loss or night sweats.  She is here today for evaluation and repeat CBC, iron study and ferritin.  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  MEDICATIONS:  Current Outpatient Medications  Medication Sig Dispense Refill   albuterol (VENTOLIN HFA) 108 (90 Base) MCG/ACT inhaler Inhale 1-2 puffs into the lungs every 6 (six) hours as needed for wheezing or shortness of breath. (Patient not taking: Reported on 09/07/2020) 18 g 0   Cholecalciferol (VITAMIN D3) 1.25 MG (50000 UT) CAPS Vitamin D 5,000 unit tablet  Take by oral route.     folic acid (FOLVITE) 1 MG tablet Take 1 tablet (1 mg total) by mouth daily. 30 tablet 4   No current facility-administered medications for this visit.     SURGICAL HISTORY:  Past Surgical History:  Procedure Laterality Date   ABDOMINAL HYSTERECTOMY N/A 11/19/2013   Procedure: HYSTERECTOMY ABDOMINAL;  Surgeon: Margarette Asal, MD;  Location: [facility];  Service: Gynecology;  Laterality: N/A;   ANKLE SURGERY     ligament reconstruction when patient  was 56 years old   BREAST SURGERY     CESAREAN SECTION     GASTRIC BYPASS  01/23/2001   NECK SURGERY     cervical fusion    REVIEW OF SYSTEMS:  A comprehensive review of systems was negative.   PHYSICAL EXAMINATION: General appearance: alert, cooperative, and no distress Head: Normocephalic, without obvious abnormality, atraumatic Neck: no adenopathy, no JVD, supple, symmetrical, trachea midline, and thyroid not enlarged, symmetric, no tenderness/mass/nodules Lymph nodes: Cervical, supraclavicular, and axillary nodes normal. Resp: clear to auscultation bilaterally Back: symmetric, no curvature. ROM normal. No CVA tenderness. Cardio: regular rate and rhythm, S1, S2 normal, no murmur, click, rub or gallop GI: soft, non-tender; bowel sounds normal; no masses,  no organomegaly Extremities: extremities normal, atraumatic, no cyanosis or edema  ECOG PERFORMANCE STATUS: 0 - Asymptomatic  Blood pressure (!) 142/76, pulse 68, temperature 97.6 F (36.4 C), temperature source Tympanic, resp. rate 17, height 5\' 7"  (1.702 m), weight 223 lb 4.8 oz (101.3 kg), last menstrual period 11/11/2013, SpO2 97 %.  LABORATORY DATA: Lab Results  Component Value Date   WBC 4.3 09/07/2020   HGB 10.5 (L) 09/07/2020   HCT 34.3 (L) 09/07/2020   MCV 80.0 09/07/2020  PLT 247 09/07/2020      Chemistry      Component Value Date/Time   NA 141 09/07/2020 1108   NA 141 03/16/2015 0945   NA 140 11/27/2011 1515   K 4.1 09/07/2020 1108   K 3.8 11/27/2011 1515   CL 109 09/07/2020 1108   CL 109 (H) 11/27/2011 1515   CO2 26 09/07/2020 1108   CO2 27 11/27/2011 1515   BUN 10 09/07/2020 1108   BUN 12 03/16/2015  0945   BUN 14.0 11/27/2011 1515   CREATININE 0.71 09/07/2020 1108   CREATININE 0.6 11/27/2011 1515      Component Value Date/Time   CALCIUM 8.5 (L) 09/07/2020 1108   CALCIUM 8.5 11/27/2011 1515   ALKPHOS 105 09/07/2020 1108   ALKPHOS 160 (H) 11/27/2011 1515   AST 30 09/07/2020 1108   AST 28 11/27/2011 1515   ALT 28 09/07/2020 1108   ALT 25 11/27/2011 1515   BILITOT 1.0 09/07/2020 1108   BILITOT 0.81 11/27/2011 1515       RADIOGRAPHIC STUDIES: No results found.  ASSESSMENT AND PLAN: This is a very pleasant 56 years old white female with iron deficiency anemia secondary to malabsorption issue after gastric bypass surgery.  The patient also has vitamin B12 deficiency. She was treated with iron infusion with Feraheme 510 Mg IV weekly for 2 weeks in August 2022 in addition to vitamin B12 injections.  She tolerated her treatment fairly well. She had repeat CBC today that showed improvement of her hemoglobin and hematocrit within normal range.  Her hemoglobin is 12.7 and hematocrit 38.6%.  Iron study and ferritin are still pending. I recommended for the patient to continue on observation with repeat CBC, iron study, ferritin as well as vitamin B12 level in 3 months. Because of the vitamin B12 deficiency secondary to malabsorption, I will arrange for the patient to receive monthly injection of vitamin B12. She was advised to call immediately if she has any other concerning symptoms in the interval. The patient voices understanding of current disease status and treatment options and is in agreement with the current care plan.  All questions were answered. The patient knows to call the clinic with any problems, questions or concerns. We can certainly see the patient much sooner if necessary.  The total time spent in the appointment was 20 minutes.  Disclaimer: This note was dictated with voice recognition software. Similar sounding words can inadvertently be transcribed and may not be  corrected upon review.

## 2021-04-07 ENCOUNTER — Inpatient Hospital Stay: Payer: Commercial Managed Care - PPO | Attending: Internal Medicine

## 2021-04-07 ENCOUNTER — Other Ambulatory Visit: Payer: Self-pay

## 2021-04-07 DIAGNOSIS — E538 Deficiency of other specified B group vitamins: Secondary | ICD-10-CM | POA: Diagnosis not present

## 2021-04-07 MED ORDER — CYANOCOBALAMIN 1000 MCG/ML IJ SOLN
1000.0000 ug | Freq: Once | INTRAMUSCULAR | Status: AC
  Administered 2021-04-07: 1000 ug via INTRAMUSCULAR
  Filled 2021-04-07: qty 1

## 2021-05-05 ENCOUNTER — Inpatient Hospital Stay: Payer: Commercial Managed Care - PPO

## 2021-05-09 ENCOUNTER — Ambulatory Visit
Admission: EM | Admit: 2021-05-09 | Discharge: 2021-05-09 | Disposition: A | Discharge status: Home / Self Care | Payer: Commercial Managed Care - PPO | Attending: Family Medicine | Admitting: Family Medicine

## 2021-05-09 DIAGNOSIS — J22 Unspecified acute lower respiratory infection: Secondary | ICD-10-CM

## 2021-05-09 MED ORDER — AZITHROMYCIN 250 MG PO TABS: ORAL_TABLET | ORAL | 0 refills | Status: DC

## 2021-05-09 MED ORDER — PREDNISONE 20 MG PO TABS
40.0000 mg | ORAL_TABLET | Freq: Every day | ORAL | 0 refills | Status: DC
Start: 2021-05-09 — End: 2021-06-09

## 2021-05-09 MED ORDER — PROMETHAZINE-DM 6.25-15 MG/5ML PO SYRP: 5.0000 mL | ORAL_SOLUTION | Freq: Four times a day (QID) | ORAL | 0 refills | Status: DC | PRN

## 2021-05-09 NOTE — ED Triage Notes (Signed)
Pt states she thinks she has some upper respiratory infection that started on Thursday morning ? ?Pt states her chest was rattling and she was wheezing all night ? ?Pt states she is coughing,body aches, sore throat and green mucus ? ?Pt states she took some Ibuprofen  ? ?Denies Fever ?

## 2021-05-09 NOTE — ED Provider Notes (Signed)
?[facility] ? ? ? ?CSN: [id number] ?Arrival date & time: 05/09/21  1538 ? ? ?  ? ?History   ?Chief Complaint ?Chief Complaint  ?Patient presents with  ? sinus issues  ? ? ?HPI ?Katie Salas is a 56 y.o. female.  ? ?Presenting today with almost a week of hacking cough, wheezing, chest rattling, initially body aches, fever, sore throat, green nasal congestion.  She states she was significantly improving over the weekend and then this morning states she felt much worse.  States she did not sleep last night well because of the rattling in her chest and wheezing, chest tightness and feeling like she was drowning.  Taking ibuprofen, over-the-counter cold and congestion medication with no relief.  No known sick contacts recently.  Does note that she has lung scarring from past episode of COVID, otherwise no known chronic pulmonary disease. ? ?Past Medical History:  ?Diagnosis Date  ? Anemia 2008  ? Gastric bypass status for obesity 03/27/2012  ? GERD (gastroesophageal reflux disease)   ? Hypertension   ? with pregnancy  ? PONV (postoperative nausea and vomiting)   ? 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 ?Patient Active Problem List  ? Diagnosis Date Noted  ? Vitamin B12 deficient megaloblastic anemia 09/07/2020  ? Low folate 09/07/2020  ? Leiomyoma 11/19/2013  ? 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 DJD (degenerative joint disease) of cervical spine 03/27/2012  ? Gastric bypass status for obesity 03/27/2012  ? Anemia, iron deficiency 12/12/2011  ? ?Past Surgical History:  ?Procedure Laterality Date  ? ABDOMINAL HYSTERECTOMY N/A 11/19/2013  ? Procedure: HYSTERECTOMY ABDOMINAL;  Surgeon: Margarette Asal, MD;  Location: [facility];  Service: Gynecology;  Laterality: N/A;  ? ANKLE SURGERY    ? ligament reconstruction when patient  was 56 years old  ? BREAST SURGERY    ? CESAREAN SECTION    ? GASTRIC BYPASS  01/23/2001  ? NECK SURGERY    ? cervical fusion  ? ?OB History   ?No obstetric history on file. ?  ? ?Home Medications   ? ?Prior to Admission  medications   ?Medication Sig Start Date End Date Taking? Authorizing Provider  ?azithromycin (ZITHROMAX) 250 MG tablet Take first 2 tablets together, then 1 every day until finished. 05/09/21  Yes Volney American, PA-C  ?predniSONE (DELTASONE) 20 MG tablet Take 2 tablets (40 mg total) by mouth daily with breakfast. 05/09/21  Yes Volney American, PA-C  ?promethazine-dextromethorphan (PROMETHAZINE-DM) 6.25-15 MG/5ML syrup Take 5 mLs by mouth 4 (four) times daily as needed. 05/09/21  Yes Volney American, PA-C  ? ?Family History ?Family History  ?Problem Relation Age of Onset  ? Diabetes Mother   ? Liver disease Mother   ? Hypertension Father   ? Cancer Father   ? Heart attack Father   ? ?Social History ?Social History  ? ?Tobacco Use  ? Smoking status: Never  ?  Passive exposure: Never  ? Smokeless tobacco: Never  ?Vaping Use  ? Vaping Use: Never used  ?Substance Use Topics  ? Alcohol use: Yes  ?  Alcohol/week: 0.0 standard drinks  ?  Comment: rarely  ? Drug use: No  ? ?Allergies   ?Darvocet [propoxyphene n-acetaminophen], Adhesive [tape], and Sulfa antibiotics ? ? ?Review of Systems ?Review of Systems ?Per HPI ? ?Physical Exam ?Triage Vital Signs ?ED Triage Vitals  ?Enc Vitals Group  ?   BP 05/09/21 1549 99/64  ?   Pulse Rate 05/09/21 1549 86  ?   Resp 05/09/21  1549 18  ?   Temp 05/09/21 1549 98.3 ?F (36.8 ?C)  ?   Temp Source 05/09/21 1549 Oral  ?   SpO2 05/09/21 1549 96 %  ?   Weight --   ?   Height --   ?   Head Circumference --   ?   Peak Flow --   ?   Pain Score 05/09/21 1546 5  ?   Pain Loc --   ?   Pain Edu? --   ?   Excl. in [place]? --   ? ?No data found. ? ?Updated Vital Signs ?BP 99/64 (BP Location: Right Arm)   Pulse 86   Temp 98.3 ?F (36.8 ?C) (Oral)   Resp 18   LMP 11/11/2013   SpO2 96%  ? ?Visual Acuity ?Right Eye Distance:   ?Left Eye Distance:   ?Bilateral Distance:   ? ?Right Eye Near:   ?Left Eye Near:    ?Bilateral Near:    ? ?Physical Exam ?Vitals and nursing note reviewed.   ?Constitutional:   ?   Appearance: Normal appearance. She is not ill-appearing.  ?HENT:  ?   Head: Atraumatic.  ?   Right Ear: Tympanic membrane normal.  ?   Left Ear: Tympanic membrane normal.  ?   Nose: Congestion present.  ?   Mouth/Throat:  ?   Mouth: Mucous membranes are moist.  ?   Pharynx: Posterior oropharyngeal erythema present.  ?Eyes:  ?   Extraocular Movements: Extraocular movements intact.  ?   Conjunctiva/sclera: Conjunctivae normal.  ?Cardiovascular:  ?   Rate and Rhythm: Normal rate and regular rhythm.  ?   Heart sounds: Normal heart sounds.  ?Pulmonary:  ?   Effort: Pulmonary effort is normal.  ?   Breath sounds: Wheezing present. No rales.  ?Musculoskeletal:     ?   General: Normal range of motion.  ?   Cervical back: Normal range of motion and neck supple.  ?Skin: ?   General: Skin is warm and dry.  ?Neurological:  ?   Mental Status: She is alert and oriented to person, place, and time.  ?Psychiatric:     ?   Mood and Affect: Mood normal.     ?   Thought Content: Thought content normal.     ?   Judgment: Judgment normal.  ? ? ? ?UC Treatments / Results  ?Labs ?(all labs ordered are listed, but only abnormal results are displayed) ?Labs Reviewed - No data to display ? ?EKG ? ? ?Radiology ?No results found. ? ?Procedures ?Procedures (including critical care time) ? ?Medications Ordered in UC ?Medications - No data to display ? ?Initial Impression / Assessment and Plan / UC Course  ?I have reviewed the triage vital signs and the nursing notes. ? ?Pertinent labs & imaging results that were available during my care of the patient were reviewed by me and considered in my medical decision making (see chart for details). ? ?  ? ?Given duration and worsening course, will cover with azithromycin, prednisone, Phenergan DM.  Discussed supportive over-the-counter medications and home care.  Work note given. ? ?Final Clinical Impressions(s) / UC Diagnoses  ? ?Final diagnoses:  ?Lower respiratory infection   ? ?Discharge Instructions   ?None ?  ? ?ED Prescriptions   ? ? Medication Sig Dispense Auth. Provider  ? azithromycin (ZITHROMAX) 250 MG tablet Take first 2 tablets together, then 1 every day until finished. 6 tablet Volney American, [place]  ? promethazine-dextromethorphan (  PROMETHAZINE-DM) 6.25-15 MG/5ML syrup Take 5 mLs by mouth 4 (four) times daily as needed. 100 mL Volney American, PA-C  ? predniSONE (DELTASONE) 20 MG tablet Take 2 tablets (40 mg total) by mouth daily with breakfast. 10 tablet Volney American, [place]  ? ?  ? ?PDMP not reviewed this encounter. ?  ?Volney American, PA-C ?05/09/21 1628 ? ?

## 2021-05-11 ENCOUNTER — Inpatient Hospital Stay: Payer: Commercial Managed Care - PPO | Attending: Internal Medicine

## 2021-05-11 ENCOUNTER — Telehealth: Payer: Self-pay | Admitting: *Deleted

## 2021-05-11 DIAGNOSIS — E538 Deficiency of other specified B group vitamins: Secondary | ICD-10-CM | POA: Insufficient documentation

## 2021-05-11 NOTE — Telephone Encounter (Signed)
Notified by [facility] Flush/Inj room that patient missed appt this am for B12 injection. ?Contacted patient - LVM that patient missed appt and that CC Scheduler will contact her to r/s at her earliest convenience. ?Schedule message sent ?

## 2021-05-12 ENCOUNTER — Telehealth: Payer: Self-pay | Admitting: Internal Medicine

## 2021-05-12 NOTE — Telephone Encounter (Signed)
Per 4/19 in basket called and spoke to pt about scheduling a B12 injection Pt confirmed appointment  ?

## 2021-05-13 ENCOUNTER — Ambulatory Visit
Admission: EM | Admit: 2021-05-13 | Discharge: 2021-05-13 | Disposition: A | Discharge status: Home / Self Care | Payer: Commercial Managed Care - PPO | Attending: Family Medicine | Admitting: Family Medicine

## 2021-05-13 DIAGNOSIS — J209 Acute bronchitis, unspecified: Secondary | ICD-10-CM | POA: Diagnosis not present

## 2021-05-13 MED ORDER — ALBUTEROL SULFATE HFA 108 (90 BASE) MCG/ACT IN AERS
1.0000 | INHALATION_SPRAY | Freq: Four times a day (QID) | RESPIRATORY_TRACT | 0 refills | Status: AC | PRN
Start: 2021-05-13 — End: ?

## 2021-05-13 MED ORDER — BUDESONIDE-FORMOTEROL FUMARATE 160-4.5 MCG/ACT IN AERO: 2.0000 | INHALATION_SPRAY | Freq: Two times a day (BID) | RESPIRATORY_TRACT | 2 refills | Status: DC

## 2021-05-13 NOTE — ED Triage Notes (Signed)
Pt states her cough is no better and she is having SOB and chest is burning ? ?Pt states she has about 2 more Prednisone's left and everything else she was given on Monday is finished ?

## 2021-05-13 NOTE — ED Provider Notes (Signed)
?[place] ? ? ? ?CSN: [id number] ?Arrival date & time: 05/13/21  1642 ? ? ?  ? ?History   ?Chief Complaint ?Chief Complaint  ?Patient presents with  ? Chest Pain  ?  Chest pain, SOB and cough  ? ?HPI ?Katie Salas is a 56 y.o. female.  ? ?Presenting today with ongoing cough, chest tightness, shortness of breath, wheezing.  Was seen earlier this week for the same, given azithromycin, Phenergan DM, prednisone and states this has not helped much.  Denies fever, chills, abdominal pain, nausea vomiting or diarrhea.  History of lung scarring from COVID-19 last year, prior to this had never had pulmonary issues in the past. ? ? ?Past Medical History:  ?Diagnosis Date  ? Anemia 2008  ? Gastric bypass status for obesity 03/27/2012  ? GERD (gastroesophageal reflux disease)   ? Hypertension   ? with pregnancy  ? PONV (postoperative nausea and vomiting)   ? 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 ? ?Patient Active Problem List  ? Diagnosis Date Noted  ? Vitamin B12 deficient megaloblastic anemia 09/07/2020  ? Low folate 09/07/2020  ? Leiomyoma 11/19/2013  ? 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 DJD (degenerative joint disease) of cervical spine 03/27/2012  ? Gastric bypass status for obesity 03/27/2012  ? Anemia, iron deficiency 12/12/2011  ? ?Past Surgical History:  ?Procedure Laterality Date  ? ABDOMINAL HYSTERECTOMY N/A 11/19/2013  ? Procedure: HYSTERECTOMY ABDOMINAL;  Surgeon: Margarette Asal, MD;  Location: [facility];  Service: Gynecology;  Laterality: N/A;  ? ANKLE SURGERY    ? ligament reconstruction when patient  was 56 years old  ? BREAST SURGERY    ? CESAREAN SECTION    ? GASTRIC BYPASS  01/23/2001  ? NECK SURGERY    ? cervical fusion  ? ? ?OB History   ?No obstetric history on file. ?  ? ? ? ?Home Medications   ? ?Prior to Admission medications   ?Medication Sig Start Date End Date Taking? Authorizing Provider  ?albuterol (VENTOLIN HFA) 108 (90 Base) MCG/ACT inhaler Inhale 1-2 puffs into the lungs every 6 (six) hours as needed for  wheezing or shortness of breath. 05/13/21  Yes Volney American, PA-C  ?budesonide-formoterol (SYMBICORT) 160-4.5 MCG/ACT inhaler Inhale 2 puffs into the lungs 2 (two) times daily. 05/13/21  Yes Volney American, PA-C  ?azithromycin (ZITHROMAX) 250 MG tablet Take first 2 tablets together, then 1 every day until finished. 05/09/21   Volney American, PA-C  ?predniSONE (DELTASONE) 20 MG tablet Take 2 tablets (40 mg total) by mouth daily with breakfast. 05/09/21   Volney American, PA-C  ?promethazine-dextromethorphan (PROMETHAZINE-DM) 6.25-15 MG/5ML syrup Take 5 mLs by mouth 4 (four) times daily as needed. 05/09/21   Volney American, PA-C  ? ? ?Family History ?Family History  ?Problem Relation Age of Onset  ? Diabetes Mother   ? Liver disease Mother   ? Hypertension Father   ? Cancer Father   ? Heart attack Father   ? ? ?Social History ?Social History  ? ?Tobacco Use  ? Smoking status: Never  ?  Passive exposure: Never  ? Smokeless tobacco: Never  ?Vaping Use  ? Vaping Use: Never used  ?Substance Use Topics  ? Alcohol use: Yes  ?  Alcohol/week: 0.0 standard drinks  ?  Comment: rarely  ? Drug use: No  ? ? ? ?Allergies   ?Darvocet [propoxyphene n-acetaminophen], Adhesive [tape], and Sulfa antibiotics ? ? ?Review of Systems ?Review of Systems ?Per HPI ? ?Physical Exam ?  Triage Vital Signs ?ED Triage Vitals  ?Enc Vitals Group  ?   BP 05/13/21 1703 123/76  ?   Pulse Rate 05/13/21 1703 75  ?   Resp 05/13/21 1703 18  ?   Temp 05/13/21 1703 98.2 ?F (36.8 ?C)  ?   Temp Source 05/13/21 1703 Oral  ?   SpO2 05/13/21 1703 94 %  ?   Weight --   ?   Height --   ?   Head Circumference --   ?   Peak Flow --   ?   Pain Score 05/13/21 1705 5  ?   Pain Loc --   ?   Pain Edu? --   ?   Excl. in [place]? --   ? ?No data found. ? ?Updated Vital Signs ?BP 123/76 (BP Location: Right Arm)   Pulse 75   Temp 98.2 ?F (36.8 ?C) (Oral)   Resp 18   LMP 11/11/2013   SpO2 94%  ? ?Visual Acuity ?Right Eye Distance:   ?Left Eye  Distance:   ?Bilateral Distance:   ? ?Right Eye Near:   ?Left Eye Near:    ?Bilateral Near:    ? ?Physical Exam ?Vitals and nursing note reviewed.  ?Constitutional:   ?   Appearance: Normal appearance. She is not ill-appearing.  ?HENT:  ?   Head: Atraumatic.  ?   Nose: Nose normal.  ?   Mouth/Throat:  ?   Mouth: Mucous membranes are moist.  ?   Pharynx: Oropharynx is clear.  ?Eyes:  ?   Extraocular Movements: Extraocular movements intact.  ?   Conjunctiva/sclera: Conjunctivae normal.  ?Cardiovascular:  ?   Rate and Rhythm: Normal rate and regular rhythm.  ?   Heart sounds: Normal heart sounds.  ?Pulmonary:  ?   Effort: Pulmonary effort is normal.  ?   Breath sounds: Wheezing present. No rales.  ?Musculoskeletal:     ?   General: Normal range of motion.  ?   Cervical back: Normal range of motion and neck supple.  ?Skin: ?   General: Skin is warm and dry.  ?   Findings: No erythema or rash.  ?Neurological:  ?   Mental Status: She is alert and oriented to person, place, and time.  ?   Motor: No weakness.  ?   Gait: Gait normal.  ?Psychiatric:     ?   Mood and Affect: Mood normal.     ?   Thought Content: Thought content normal.     ?   Judgment: Judgment normal.  ? ?UC Treatments / Results  ?Labs ?(all labs ordered are listed, but only abnormal results are displayed) ?Labs Reviewed - No data to display ? ?EKG ? ?Radiology ?No results found. ? ?Procedures ?Procedures (including critical care time) ? ?Medications Ordered in UC ?Medications - No data to display ? ?Initial Impression / Assessment and Plan / UC Course  ?I have reviewed the triage vital signs and the nursing notes. ? ?Pertinent labs & imaging results that were available during my care of the patient were reviewed by me and considered in my medical decision making (see chart for details). ? ?  ? ?We will add albuterol for as needed use, Symbicort consistently until symptoms resolve.  Complete remainder of prednisone course.  Return for worsening  symptoms. ? ?Final Clinical Impressions(s) / UC Diagnoses  ? ?Final diagnoses:  ?Acute bronchitis, unspecified organism  ? ?Discharge Instructions   ?None ?  ? ?ED Prescriptions   ? ?  Medication Sig Dispense Auth. Provider  ? albuterol (VENTOLIN HFA) 108 (90 Base) MCG/ACT inhaler Inhale 1-2 puffs into the lungs every 6 (six) hours as needed for wheezing or shortness of breath. [place], [place]  ? budesonide-formoterol [facility]) 160-4.5 MCG/ACT inhaler Inhale 2 puffs into the lungs 2 (two) times daily. 1 each Volney American, PA-C  ? ?  ? ?PDMP not reviewed this encounter. ?  ?Volney American, PA-C ?05/13/21 1830 ? ?

## 2021-05-19 ENCOUNTER — Other Ambulatory Visit: Payer: Self-pay

## 2021-05-19 ENCOUNTER — Inpatient Hospital Stay: Payer: Commercial Managed Care - PPO

## 2021-05-19 DIAGNOSIS — E538 Deficiency of other specified B group vitamins: Secondary | ICD-10-CM | POA: Diagnosis present

## 2021-05-19 DIAGNOSIS — D531 Other megaloblastic anemias, not elsewhere classified: Secondary | ICD-10-CM

## 2021-05-19 DIAGNOSIS — D508 Other iron deficiency anemias: Secondary | ICD-10-CM

## 2021-05-19 MED ORDER — CYANOCOBALAMIN 1000 MCG/ML IJ SOLN
1000.0000 ug | Freq: Once | INTRAMUSCULAR | Status: AC
  Administered 2021-05-19: 1000 ug via INTRAMUSCULAR
  Filled 2021-05-19: qty 1

## 2021-06-04 ENCOUNTER — Other Ambulatory Visit: Payer: Self-pay | Admitting: Family Medicine

## 2021-06-09 ENCOUNTER — Other Ambulatory Visit: Payer: Commercial Managed Care - PPO

## 2021-06-09 ENCOUNTER — Other Ambulatory Visit: Payer: Self-pay

## 2021-06-09 ENCOUNTER — Inpatient Hospital Stay: Payer: Commercial Managed Care - PPO

## 2021-06-09 ENCOUNTER — Encounter: Payer: Self-pay | Admitting: Internal Medicine

## 2021-06-09 ENCOUNTER — Inpatient Hospital Stay: Payer: Commercial Managed Care - PPO | Attending: Internal Medicine | Admitting: Internal Medicine

## 2021-06-09 VITALS — BP 146/101 | HR 77 | Temp 97.1°F | Resp 16 | Wt 226.4 lb

## 2021-06-09 DIAGNOSIS — D508 Other iron deficiency anemias: Secondary | ICD-10-CM

## 2021-06-09 DIAGNOSIS — D531 Other megaloblastic anemias, not elsewhere classified: Secondary | ICD-10-CM | POA: Diagnosis not present

## 2021-06-09 DIAGNOSIS — D509 Iron deficiency anemia, unspecified: Secondary | ICD-10-CM | POA: Diagnosis not present

## 2021-06-09 DIAGNOSIS — E538 Deficiency of other specified B group vitamins: Secondary | ICD-10-CM | POA: Diagnosis present

## 2021-06-09 LAB — CBC WITH DIFFERENTIAL (CANCER CENTER ONLY)
Abs Immature Granulocytes: 0.02 10*3/uL (ref 0.00–0.07)
Basophils Absolute: 0 10*3/uL (ref 0.0–0.1)
Basophils Relative: 1 %
Eosinophils Absolute: 0.2 10*3/uL (ref 0.0–0.5)
Eosinophils Relative: 3 %
HCT: 40.3 % (ref 36.0–46.0)
Hemoglobin: 13.2 g/dL (ref 12.0–15.0)
Immature Granulocytes: 0 %
Lymphocytes Relative: 23 %
Lymphs Abs: 1.3 10*3/uL (ref 0.7–4.0)
MCH: 28.7 pg (ref 26.0–34.0)
MCHC: 32.8 g/dL (ref 30.0–36.0)
MCV: 87.6 fL (ref 80.0–100.0)
Monocytes Absolute: 0.4 10*3/uL (ref 0.1–1.0)
Monocytes Relative: 8 %
Neutro Abs: 3.9 10*3/uL (ref 1.7–7.7)
Neutrophils Relative %: 65 %
Platelet Count: 243 10*3/uL (ref 150–400)
RBC: 4.6 MIL/uL (ref 3.87–5.11)
RDW: 12.9 % (ref 11.5–15.5)
WBC Count: 5.9 10*3/uL (ref 4.0–10.5)
nRBC: 0 % (ref 0.0–0.2)

## 2021-06-09 LAB — IRON AND IRON BINDING CAPACITY (CC-WL,HP ONLY)
Iron: 78 ug/dL (ref 28–170)
Saturation Ratios: 22 % (ref 10.4–31.8)
TIBC: 353 ug/dL (ref 250–450)
UIBC: 275 ug/dL (ref 148–442)

## 2021-06-09 LAB — FERRITIN: Ferritin: 53 ng/mL (ref 11–307)

## 2021-06-09 LAB — VITAMIN B12: Vitamin B-12: 325 pg/mL (ref 180–914)

## 2021-06-09 MED ORDER — CYANOCOBALAMIN 1000 MCG/ML IJ SOLN
1000.0000 ug | Freq: Once | INTRAMUSCULAR | Status: AC
  Administered 2021-06-09: 1000 ug via INTRAMUSCULAR
  Filled 2021-06-09: qty 1

## 2021-06-09 NOTE — Progress Notes (Signed)
[place] [phone]   [phone]  OFFICE PROGRESS NOTE  Scifres, Earlie Server, PA-C (Inactive) No address on file  DIAGNOSIS: history of persistent anemia secondary to malabsorption secondary to gastric bypass surgery in 2003  PRIOR THERAPY:None.  CURRENT THERAPY: 1) Feraheme infusion 510 Mg IV weekly for 2 weeks. 2) vitamin B12 injection weekly for 4 weeks followed by monthly injection.  INTERVAL HISTORY: Katie Salas 56 y.o. female returns to the clinic today for follow-up visit.  The patient is feeling fine today with no concerning complaints except for mild fatigue and occasional shortness of breath with exertion.  She denied having any chest pain, cough or hemoptysis.  She denied having any nausea or vomiting except when she eats a big meal.  She has no diarrhea or constipation.  She has no headache or visual changes.  She has no recent weight loss or night sweats.  The patient is here today for evaluation and repeat blood work.  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  MEDICATIONS:  Current Outpatient Medications  Medication Sig Dispense Refill   albuterol (VENTOLIN HFA) 108 (90 Base) MCG/ACT inhaler Inhale 1-2 puffs into the lungs every 6 (six) hours as needed for wheezing or shortness of breath. 18 g 0   budesonide-formoterol (SYMBICORT) 160-4.5 MCG/ACT inhaler Inhale 2 puffs into the lungs 2 (two) times daily. 1 each 2   No current facility-administered medications for this visit.    SURGICAL HISTORY:  Past Surgical History:  Procedure Laterality Date   ABDOMINAL HYSTERECTOMY N/A 11/19/2013    Procedure: HYSTERECTOMY ABDOMINAL;  Surgeon: Margarette Asal, MD;  Location: [facility];  Service: Gynecology;  Laterality: N/A;   ANKLE SURGERY     ligament reconstruction when patient  was 56 years old   BREAST SURGERY     CESAREAN SECTION     GASTRIC BYPASS  01/23/2001   NECK SURGERY     cervical fusion    REVIEW OF SYSTEMS:  A comprehensive review of systems was negative except for: Constitutional: positive for fatigue Respiratory: positive for dyspnea on exertion Gastrointestinal: positive for nausea and vomiting   PHYSICAL EXAMINATION: General appearance: alert, cooperative, fatigued, and no distress Head: Normocephalic, without obvious abnormality, atraumatic Neck: no adenopathy, no JVD, supple, symmetrical, trachea midline, and thyroid not enlarged, symmetric, no tenderness/mass/nodules Lymph nodes: Cervical, supraclavicular, and axillary nodes normal. Resp: clear to auscultation bilaterally Back: symmetric, no curvature. ROM normal. No CVA tenderness. Cardio: regular rate and rhythm, S1, S2 normal, no murmur, click, rub or gallop GI: soft, non-tender; bowel sounds normal; no masses,  no organomegaly Extremities: extremities normal, atraumatic, no cyanosis or edema  ECOG PERFORMANCE STATUS: 0 - Asymptomatic  Blood pressure (!) 146/101, pulse 77, temperature (!) 97.1 F (36.2 C), temperature source Tympanic, resp. rate 16, weight 226 lb 7 oz (102.7 kg), last menstrual period 11/11/2013, SpO2 98 %.  LABORATORY DATA: Lab Results  Component Value Date   WBC 4.7 03/10/2021   HGB 12.7 03/10/2021   HCT 38.6 03/10/2021   MCV 88.1 03/10/2021   PLT 218 03/10/2021      Chemistry      Component  Value Date/Time   NA 141 09/07/2020 1108   NA 141 03/16/2015 0945   NA 140 11/27/2011 1515   K 4.1 09/07/2020 1108   K 3.8 11/27/2011 1515   CL 109 09/07/2020 1108   CL 109 (H) 11/27/2011 1515   CO2 26 09/07/2020 1108   CO2 27 11/27/2011 1515   BUN 10 09/07/2020 1108   BUN 12  03/16/2015 0945   BUN 14.0 11/27/2011 1515   CREATININE 0.71 09/07/2020 1108   CREATININE 0.6 11/27/2011 1515      Component Value Date/Time   CALCIUM 8.5 (L) 09/07/2020 1108   CALCIUM 8.5 11/27/2011 1515   ALKPHOS 105 09/07/2020 1108   ALKPHOS 160 (H) 11/27/2011 1515   AST 30 09/07/2020 1108   AST 28 11/27/2011 1515   ALT 28 09/07/2020 1108   ALT 25 11/27/2011 1515   BILITOT 1.0 09/07/2020 1108   BILITOT 0.81 11/27/2011 1515       RADIOGRAPHIC STUDIES: No results found.  ASSESSMENT AND PLAN: 56 years old white female with iron deficiency anemia secondary to malabsorption issue after gastric bypass surgery.  The patient also has vitamin B12 deficiency. She was treated with iron infusion with Feraheme 510 Mg IV weekly for 2 weeks in August 2022 in addition to vitamin B12 injections.  She tolerated her treatment fairly well. The patient is doing fine today with no concerning complaints except for mild fatigue and shortness of breath with exertion. Repeat CBC today is unremarkable for any concerning abnormalities and her hemoglobin and hematocrit are within the normal range.  Iron study, ferritin and vitamin B12 level are still pending. I recommended for the patient to continue her current treatment with vitamin B12 injection on a monthly basis.  I will wait for the iron study to decide if the patient will need any additional iron infusion or not. I will see her back for follow-up visit in 6 months for evaluation and repeat blood work. She was advised to call immediately if she has any other concerning symptoms in the interval. The patient voices understanding of current disease status and treatment options and is in agreement with the current care plan.  All questions were answered. The patient knows to call the clinic with any problems, questions or concerns. We can certainly see the patient much sooner if necessary.  The total time spent in the appointment  was 20 minutes.  Disclaimer: This note was dictated with voice recognition software. Similar sounding words can inadvertently be transcribed and may not be corrected upon review.

## 2021-06-12 ENCOUNTER — Ambulatory Visit
Admission: EM | Admit: 2021-06-12 | Discharge: 2021-06-12 | Disposition: A | Discharge status: Home / Self Care | Payer: Commercial Managed Care - PPO | Attending: Family Medicine | Admitting: Family Medicine

## 2021-06-12 ENCOUNTER — Ambulatory Visit (INDEPENDENT_AMBULATORY_CARE_PROVIDER_SITE_OTHER): Payer: Commercial Managed Care - PPO

## 2021-06-12 DIAGNOSIS — R0789 Other chest pain: Secondary | ICD-10-CM

## 2021-06-12 DIAGNOSIS — J3089 Other allergic rhinitis: Secondary | ICD-10-CM

## 2021-06-12 DIAGNOSIS — R059 Cough, unspecified: Secondary | ICD-10-CM

## 2021-06-12 DIAGNOSIS — R053 Chronic cough: Secondary | ICD-10-CM | POA: Diagnosis not present

## 2021-06-12 DIAGNOSIS — U099 Post covid-19 condition, unspecified: Secondary | ICD-10-CM

## 2021-06-12 DIAGNOSIS — K219 Gastro-esophageal reflux disease without esophagitis: Secondary | ICD-10-CM

## 2021-06-12 MED ORDER — PANTOPRAZOLE SODIUM 40 MG PO TBEC: 40.0000 mg | DELAYED_RELEASE_TABLET | Freq: Every day | ORAL | 2 refills | Status: AC

## 2021-06-12 NOTE — ED Triage Notes (Signed)
Pt states her cough has gotten better for a few days and then it comes back stronger  Pt states she finished all of her meds and did everything that she was prescribed  Denies Fever

## 2021-06-12 NOTE — ED Provider Notes (Signed)
[facility]    CSN: [id number] Arrival date & time: 06/12/21  5176      History   Chief Complaint Chief Complaint  Patient presents with   Cough    HPI Katie Salas is a 56 y.o. female.   Presenting today with persistent waxing and waning cough now also return of raw feeling throat.  Denies fever, chills, body aches, chest pain, shortness of breath, new sick contacts.  Takes a consistent allergy regimen with antihistamines, nasal sprays, recently completed 2 courses of antibiotics, steroids for lower respiratory infection bronchitis and has been on Symbicort and albuterol as needed for several weeks with minimal improvement.  Followed by pulmonology for long COVID with lung scarring.  She does note that she is also having some worsening acid reflux symptoms and wonders if this has anything to do with her ongoing cough.  Takes Pepcid and Tums with no relief.   Past Medical History:  Diagnosis Date   Anemia 2008   Gastric bypass status for obesity 03/27/2012   GERD (gastroesophageal reflux disease)    Hypertension    with pregnancy   PONV (postoperative nausea and vomiting)    Thymoma 03/27/2012    Patient Active Problem List   Diagnosis Date Noted   Vitamin B12 deficient megaloblastic anemia 09/07/2020   Low folate 09/07/2020   Leiomyoma 11/19/2013   Thymoma 03/27/2012   DJD (degenerative joint disease) of cervical spine 03/27/2012   Gastric bypass status for obesity 03/27/2012   Anemia, iron deficiency 12/12/2011    Past Surgical History:  Procedure Laterality Date   ABDOMINAL HYSTERECTOMY N/A 11/19/2013   Procedure: HYSTERECTOMY ABDOMINAL;  Surgeon: Margarette Asal, MD;  Location: [facility];  Service: Gynecology;  Laterality: N/A;   ANKLE SURGERY     ligament reconstruction when patient  was 56 years old   BREAST SURGERY     CESAREAN SECTION     GASTRIC BYPASS  01/23/2001   NECK SURGERY     cervical fusion    OB History   No obstetric history on  file.      Home Medications    Prior to Admission medications   Medication Sig Start Date End Date Taking? Authorizing Provider  pantoprazole (PROTONIX) 40 MG tablet Take 1 tablet (40 mg total) by mouth daily. 06/12/21  Yes Volney American, PA-C  albuterol (VENTOLIN HFA) 108 (90 Base) MCG/ACT inhaler Inhale 1-2 puffs into the lungs every 6 (six) hours as needed for wheezing or shortness of breath. 05/13/21   Volney American, PA-C  budesonide-formoterol [facility]) 160-4.5 MCG/ACT inhaler Inhale 2 puffs into the lungs 2 (two) times daily. 05/13/21   Volney American, PA-C    Family History Family History  Problem Relation Age of Onset   Diabetes Mother    Liver disease Mother    Hypertension Father    Cancer Father    Heart attack Father     Social History Social History   Tobacco Use   Smoking status: Never    Passive exposure: Never   Smokeless tobacco: Never  Vaping Use   Vaping Use: Never used  Substance Use Topics   Alcohol use: Yes    Alcohol/week: 0.0 standard drinks    Comment: rarely   Drug use: No     Allergies   Darvocet [propoxyphene n-acetaminophen], Adhesive [tape], and Sulfa antibiotics   Review of Systems Review of Systems Per HPI  Physical Exam Triage Vital Signs ED Triage Vitals  Enc Vitals  Group     BP 06/12/21 0953 109/71     Pulse Rate 06/12/21 0953 79     Resp 06/12/21 0953 18     Temp 06/12/21 0953 98.2 F (36.8 C)     Temp Source 06/12/21 0953 Oral     SpO2 06/12/21 0953 98 %     Weight --      Height --      Head Circumference --      Peak Flow --      Pain Score 06/12/21 0952 3     Pain Loc --      Pain Edu? --      Excl. in [place]? --    No data found.  Updated Vital Signs BP 109/71 (BP Location: Right Arm)   Pulse 79   Temp 98.2 F (36.8 C) (Oral)   Resp 18   LMP 11/11/2013   SpO2 98%   Visual Acuity Right Eye Distance:   Left Eye Distance:   Bilateral Distance:    Right Eye Near:   Left Eye  Near:    Bilateral Near:     Physical Exam Vitals and nursing note reviewed.  Constitutional:      Appearance: Normal appearance.  HENT:     Head: Atraumatic.     Right Ear: External ear normal.     Left Ear: External ear normal.     Ears:     Comments: Bilateral middle ear effusion    Nose: Nose normal.     Mouth/Throat:     Mouth: Mucous membranes are moist.     Pharynx: Posterior oropharyngeal erythema present. No oropharyngeal exudate.  Eyes:     Extraocular Movements: Extraocular movements intact.     Conjunctiva/sclera: Conjunctivae normal.  Cardiovascular:     Rate and Rhythm: Normal rate and regular rhythm.     Heart sounds: Normal heart sounds.  Pulmonary:     Effort: Pulmonary effort is normal.     Breath sounds: Normal breath sounds. No wheezing or rales.  Musculoskeletal:        General: Normal range of motion.     Cervical back: Normal range of motion and neck supple.  Lymphadenopathy:     Cervical: No cervical adenopathy.  Skin:    General: Skin is warm and dry.  Neurological:     Mental Status: She is alert and oriented to person, place, and time.     Motor: No weakness.     Gait: Gait normal.  Psychiatric:        Mood and Affect: Mood normal.        Thought Content: Thought content normal.     UC Treatments / Results  Labs (all labs ordered are listed, but only abnormal results are displayed) Labs Reviewed - No data to display  EKG   Radiology DG Chest 2 View  Result Date: 06/12/2021 CLINICAL DATA:  56 year old female with cough and chest tightness. EXAM: CHEST - 2 VIEW COMPARISON:  CTA chest 11/28/2019 and earlier. FINDINGS: Prior sternotomy. Lung volumes and mediastinal contours remain normal. Visualized tracheal air column is within normal limits. Lung markings are stable and both lungs appear clear. No pneumothorax or pleural effusion. No acute osseous abnormality identified. Prior cervical ACDF. Negative visible bowel gas. IMPRESSION: No  acute cardiopulmonary abnormality. Electronically Signed   By: Genevie Ann M.D.   On: 06/12/2021 10:11    Procedures Procedures (including critical care time)  Medications Ordered in UC Medications - No data  to display  Initial Impression / Assessment and Plan / UC Course  I have reviewed the triage vital signs and the nursing notes.  Pertinent labs & imaging results that were available during my care of the patient were reviewed by me and considered in my medical decision making (see chart for details).     No acute cardiopulmonary abnormalities on chest x-ray today, oxygen saturation 98% on room air she is overall well-appearing.  We will add Protonix to see if her chronic cough is partially worsened from reflux, continue good allergy regimen and inhalers.  Follow-up with pulmonologist for recheck.  Final Clinical Impressions(s) / UC Diagnoses   Final diagnoses:  Post-COVID chronic cough  Gastroesophageal reflux disease, unspecified whether esophagitis present  Seasonal allergic rhinitis due to other allergic trigger   Discharge Instructions   None    ED Prescriptions     Medication Sig Dispense Auth. Provider   pantoprazole (PROTONIX) 40 MG tablet Take 1 tablet (40 mg total) by mouth daily. 30 tablet Volney American, [place]      PDMP not reviewed this encounter.   Volney American, [place] 06/12/21 1054

## 2021-06-24 ENCOUNTER — Ambulatory Visit (INDEPENDENT_AMBULATORY_CARE_PROVIDER_SITE_OTHER): Payer: Commercial Managed Care - PPO | Admitting: Pulmonary Disease

## 2021-06-24 ENCOUNTER — Encounter: Payer: Self-pay | Admitting: Pulmonary Disease

## 2021-06-24 VITALS — BP 128/82 | HR 70 | Ht 67.0 in | Wt 228.0 lb

## 2021-06-24 DIAGNOSIS — R0602 Shortness of breath: Secondary | ICD-10-CM

## 2021-06-24 DIAGNOSIS — K219 Gastro-esophageal reflux disease without esophagitis: Secondary | ICD-10-CM

## 2021-06-24 DIAGNOSIS — J4520 Mild intermittent asthma, uncomplicated: Secondary | ICD-10-CM | POA: Diagnosis not present

## 2021-06-24 NOTE — Patient Instructions (Addendum)
Your cough and hoarseness of voice appear to be related to reflux disease.  Your chest x-ray from 06/12/21 is clear  We will await further workup by Dr. Benson Norway in regards to your reflux  Agree with holding symbicort at this time if your breathing symptoms are not worsened.  Follow up in 3 months with pulmonary function tests

## 2021-06-24 NOTE — Progress Notes (Signed)
Synopsis: Referred in June 2023 for Scarring of the Lung   Subjective:   PATIENT ID: Katie Salas GENDER: female DOB: 1965/08/25, MRN: [id number]   HPI  Chief Complaint  Patient presents with   Establish Care    Establishing care from the [facility] office. Last seen in 2021 for post covid. States she has noticed an increase in SOB over the past few months. Increased dry cough.    Katie Salas is a 56 year old woman, never smoker with history of post-viral reactive airways disease and GERD who is referred to pulmonary clinic for chronic cough.   She was recently seen in urgent care on 05/09/21, 05/13/21 and 06/12/21 where she was treated for bronchitis and most recently for GERD. She was started on protonix '40mg'$  daily about a week ago and has noticed she is not waking up coughing as much. She has been on symbicort 160-4.60mg 2 puffs twice daily without much relief and stopped this recently due to concern of throat irritation and hoarseness of her voice.   She is seeing Dr. HBenson [facility] for the GERD and is scheduled for EGD and colonoscopy in the coming weeks. She tries to sleep with the head of the bed elevated and is not always eating at least 2 hours prior to bedtime. She has history of gastric bypass. She also reports episodes of food getting stuck in her throat and occasional painful swallowing.  She does report some sinus congestion and post-nasal drainage at this time.   She continues to have exertional dyspnea since covid infection, most noticeable with stairs.   She is a never smoker. Reports second hand smoke from her father in childhood. She currently works as a pRadio broadcast assistant Previously worked in [employer] a [employer] site with exposure to dusts.   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     Family History  Problem Relation Age of Onset   Diabetes Mother    Liver disease Mother    Hypertension Father    Cancer Father    Heart attack Father      Social History   Socioeconomic History   Marital status: Married    Spouse name: Not on file   Number of children: Not on file   Years of education: Not on file   Highest education level: Not on file  Occupational History   Not on file  Tobacco Use   Smoking status: Never    Passive exposure: Never   Smokeless tobacco: Never  Vaping Use   Vaping Use: Never used  Substance and Sexual Activity   Alcohol use: Yes    Alcohol/week: 0.0 standard drinks    Comment: rarely   Drug use: No   Sexual activity: Not Currently    Birth control/protection: None  Other Topics Concern   Not on file  Social History Narrative   Not on file   Social Determinants of Health   Financial Resource Strain: Not on file  Food Insecurity: Not on file  Transportation Needs: Not on file  Physical Activity: Not on file  Stress: Not on file  Social Connections: Not on file  Intimate Partner Violence: Not on file     Allergies  Allergen Reactions   Darvocet [Propoxyphene N-Acetaminophen] Shortness Of  Breath    Does OK with percocet   Adhesive [Tape] Other (See Comments)    blisters   Sulfa Antibiotics Swelling     Outpatient Medications Prior to Visit  Medication Sig Dispense Refill   albuterol (VENTOLIN HFA) 108 (90 Base) MCG/ACT inhaler Inhale 1-2 puffs into the lungs every 6 (six) hours as needed for wheezing or shortness of breath. 18 g 0   pantoprazole (PROTONIX) 40 MG tablet Take 1 tablet (40 mg total) by mouth daily. 30 tablet 2   budesonide-formoterol (SYMBICORT) 160-4.5 MCG/ACT inhaler Inhale 2 puffs into the lungs 2 (two) times daily. (Patient not taking: Reported on 06/24/2021) 1 each 2   No facility-administered medications prior to visit.   Review of Systems  Constitutional:  Negative for chills, fever,  malaise/fatigue and weight loss.  HENT:  Positive for congestion. Negative for sinus pain and sore throat.   Eyes: Negative.   Respiratory:  Positive for cough and shortness of breath. Negative for hemoptysis, sputum production and wheezing.   Cardiovascular:  Negative for chest pain, palpitations, orthopnea, claudication and leg swelling.  Gastrointestinal:  Positive for heartburn. Negative for abdominal pain, nausea and vomiting.  Genitourinary: Negative.   Musculoskeletal:  Negative for joint pain and myalgias.  Skin:  Negative for rash.  Neurological:  Negative for weakness.  Endo/Heme/Allergies: Negative.   Psychiatric/Behavioral: Negative.     Objective:   Vitals:   06/24/21 1607  BP: 128/82  Pulse: 70  SpO2: 99%  Weight: 228 lb (103.4 kg)  Height: '5\' 7"'$  (1.702 m)     Physical Exam Constitutional:      General: She is not in acute distress.    Appearance: She is obese. She is not ill-appearing.  HENT:     Head: Normocephalic and atraumatic.     Mouth/Throat:     Pharynx: Posterior oropharyngeal erythema present.  Eyes:     General: No scleral icterus.    Conjunctiva/sclera: Conjunctivae normal.     Pupils: Pupils are equal, round, and reactive to light.  Cardiovascular:     Rate and Rhythm: Normal rate and regular rhythm.     Pulses: Normal pulses.     Heart sounds: Normal heart sounds. No murmur heard. Pulmonary:     Effort: Pulmonary effort is normal.     Breath sounds: Normal breath sounds. No wheezing, rhonchi or rales.  Abdominal:     General: Bowel sounds are normal.     Palpations: Abdomen is soft.  Musculoskeletal:     Right lower leg: No edema.     Left lower leg: No edema.  Lymphadenopathy:     Cervical: No cervical adenopathy.  Skin:    General: Skin is warm and dry.  Neurological:     General: No focal deficit present.     Mental Status: She is alert.  Psychiatric:        Mood and Affect: Mood normal.        Behavior: Behavior normal.         Thought Content: Thought content normal.        Judgment: Judgment normal.    CBC    Component Value Date/Time   WBC 5.9 06/09/2021 0901   WBC 8.4 11/20/2013 0550   RBC 4.60 06/09/2021 0901   HGB 13.2 06/09/2021 0901   HGB 12.4 03/16/2016 1022   HGB 12.0 10/01/2012 1044   HCT 40.3 06/09/2021 0901   HCT 37.2 03/16/2016 1022   HCT 37.2 10/01/2012 1044   PLT  243 06/09/2021 0901   PLT 241 03/16/2016 1022   MCV 87.6 06/09/2021 0901   MCV 87 03/16/2016 1022   MCV 88.2 10/01/2012 1044   MCH 28.7 06/09/2021 0901   MCHC 32.8 06/09/2021 0901   RDW 12.9 06/09/2021 0901   RDW 13.8 03/16/2016 1022   RDW 13.1 10/01/2012 1044   LYMPHSABS 1.3 06/09/2021 0901   LYMPHSABS 1.2 03/16/2016 1022   LYMPHSABS 1.3 10/01/2012 1044   MONOABS 0.4 06/09/2021 0901   MONOABS 0.4 10/01/2012 1044   EOSABS 0.2 06/09/2021 0901   EOSABS 0.1 03/16/2016 1022   BASOSABS 0.0 06/09/2021 0901   BASOSABS 0.0 03/16/2016 1022   BASOSABS 0.0 10/01/2012 1044      Latest Ref Rng & Units 09/07/2020   11:08 AM 03/16/2015    9:45 AM 11/27/2011    3:15 PM  BMP  Glucose 70 - 99 mg/dL 82   85   83    BUN 6 - 20 mg/dL 10   12   14.0    Creatinine 0.44 - 1.00 mg/dL 0.71   0.59   0.6    BUN/Creat Ratio 9 - 23  20     Sodium 135 - 145 mmol/L 141   141   140    Potassium 3.5 - 5.1 mmol/L 4.1   4.6   3.8    Chloride 98 - 111 mmol/L 109   105   109    CO2 22 - 32 mmol/L '26   23   27    '$ Calcium 8.9 - 10.3 mg/dL 8.5   8.5   8.5     Chest imaging: CXR 06/12/21 Prior sternotomy. Lung volumes and mediastinal contours remain normal. Visualized tracheal air column is within normal limits. Lung markings are stable and both lungs appear clear. No pneumothorax or pleural effusion. No acute osseous abnormality identified. Prior cervical ACDF. Negative visible bowel gas.  CTA Chest 11/28/19 Mediastinum/Nodes: Small gastric reservoir at inferior mediastinum from prior gastric stapling. Questionable mild wall thickening of distal  esophagus versus artifact from underdistention. Base of cervical region normal appearance. No thoracic adenopathy.   Lungs/Pleura: Patchy ill-defined infiltrates in both lungs, scattered, predominantly peripheral, question atypical infection. COVID-19 not excluded. No segmental consolidation, pleural effusion, or pneumothorax.  PFT:     View : No data to display.          Labs:  Path:  Echo:  Heart Catheterization:  Assessment & Plan:   Mild intermittent reactive airway disease without complication - Plan: Pulmonary Function Test  Shortness of breath  Gastroesophageal reflux disease without esophagitis  Discussion: Katie Salas is a 56 year old woman, never smoker with history of post-viral reactive airways disease and GERD who is referred to pulmonary clinic for chronic cough.   Her cough and hoarseness of voice appear secondary to significant GERD. She has recently started pantoprazole '40mg'$  daily. I reinforced sleeping with the head of the bed elevated and eating dinner/snacks at least 2 hours before bedtime.   She is safe to remain off symbicort inhaler if she has no worsening in her cough or dyspnea.   Her chest radiograph from 5/21 is clear and reassuring that she likely does not have residual scarring from her covid 19 infection. We will check pulmonary function tests at follow up visit and if abnormal, will consider repeat CT Chest scan.   Follow up in 3 months.   Freda Jackson, MD [facility] Office: [phone]   Current Outpatient Medications:  albuterol (VENTOLIN HFA) 108 (90 Base) MCG/ACT inhaler, Inhale 1-2 puffs into the lungs every 6 (six) hours as needed for wheezing or shortness of breath., Disp: 18 g, Rfl: 0   pantoprazole (PROTONIX) 40 MG tablet, Take 1 tablet (40 mg total) by mouth daily., Disp: 30 tablet, Rfl: 2   budesonide-formoterol (SYMBICORT) 160-4.5 MCG/ACT inhaler, Inhale 2 puffs into the lungs 2 (two) times  daily. (Patient not taking: Reported on 06/24/2021), Disp: 1 each, Rfl: 2

## 2021-06-27 ENCOUNTER — Telehealth: Payer: Self-pay | Admitting: Internal Medicine

## 2021-06-27 NOTE — Telephone Encounter (Signed)
Called patient regarding upcoming June appointments, left a voicemail. 

## 2021-07-07 ENCOUNTER — Other Ambulatory Visit: Payer: Self-pay

## 2021-07-07 ENCOUNTER — Inpatient Hospital Stay: Payer: Commercial Managed Care - PPO | Attending: Internal Medicine

## 2021-07-07 DIAGNOSIS — D531 Other megaloblastic anemias, not elsewhere classified: Secondary | ICD-10-CM

## 2021-07-07 DIAGNOSIS — E538 Deficiency of other specified B group vitamins: Secondary | ICD-10-CM | POA: Insufficient documentation

## 2021-07-07 DIAGNOSIS — D508 Other iron deficiency anemias: Secondary | ICD-10-CM

## 2021-07-07 MED ORDER — CYANOCOBALAMIN 1000 MCG/ML IJ SOLN
1000.0000 ug | Freq: Once | INTRAMUSCULAR | Status: AC
  Administered 2021-07-07: 1000 ug via INTRAMUSCULAR
  Filled 2021-07-07: qty 1

## 2021-07-21 ENCOUNTER — Encounter (INDEPENDENT_AMBULATORY_CARE_PROVIDER_SITE_OTHER): Payer: Commercial Managed Care - PPO | Admitting: Ophthalmology

## 2021-07-21 DIAGNOSIS — H43813 Vitreous degeneration, bilateral: Secondary | ICD-10-CM

## 2021-07-21 DIAGNOSIS — B399 Histoplasmosis, unspecified: Secondary | ICD-10-CM | POA: Diagnosis not present

## 2021-07-21 DIAGNOSIS — H32 Chorioretinal disorders in diseases classified elsewhere: Secondary | ICD-10-CM | POA: Diagnosis not present

## 2021-08-05 ENCOUNTER — Inpatient Hospital Stay: Payer: Commercial Managed Care - PPO | Attending: Internal Medicine

## 2021-08-05 ENCOUNTER — Other Ambulatory Visit: Payer: Self-pay

## 2021-08-05 DIAGNOSIS — E538 Deficiency of other specified B group vitamins: Secondary | ICD-10-CM | POA: Diagnosis not present

## 2021-08-05 DIAGNOSIS — D508 Other iron deficiency anemias: Secondary | ICD-10-CM

## 2021-08-05 DIAGNOSIS — D531 Other megaloblastic anemias, not elsewhere classified: Secondary | ICD-10-CM

## 2021-08-05 MED ORDER — CYANOCOBALAMIN 1000 MCG/ML IJ SOLN
1000.0000 ug | Freq: Once | INTRAMUSCULAR | Status: AC
  Administered 2021-08-05: 1000 ug via INTRAMUSCULAR
  Filled 2021-08-05: qty 1

## 2021-08-05 NOTE — Patient Instructions (Signed)
Vitamin B12 Deficiency Vitamin B12 deficiency occurs when the body does not have enough of this important vitamin. The body needs this vitamin: To make red blood cells. To make DNA. This is the genetic material inside cells. To help the nerves work properly so they can carry messages from the brain to the body. Vitamin B12 deficiency can cause health problems, such as not having enough red blood cells in the blood (anemia). This can lead to nerve damage if untreated. What are the causes? This condition may be caused by: Not eating enough foods that contain vitamin B12. Not having enough stomach acid and digestive fluids to properly absorb vitamin B12 from the food that you eat. Having certain diseases that make it hard to absorb vitamin B12. These diseases include Crohn's disease, chronic pancreatitis, and cystic fibrosis. An autoimmune disorder in which the body does not make enough of a protein (intrinsic factor) within the stomach, resulting in not enough absorption of vitamin B12. Having a surgery in which part of the stomach or small intestine is removed. Taking certain medicines that make it hard for the body to absorb vitamin B12. These include: Heartburn medicines, such as antacids and proton pump inhibitors. Some medicines that are used to treat diabetes. What increases the risk? The following factors may make you more likely to develop a vitamin B12 deficiency: Being an older adult. Eating a vegetarian or vegan diet that does not include any foods that come from animals. Eating a poor diet while you are pregnant. Taking certain medicines. Having alcoholism. What are the signs or symptoms? In some cases, there are no symptoms of this condition. If the condition leads to anemia or nerve damage, various symptoms may occur, such as: Weakness. Tiredness (fatigue). Loss of appetite. Numbness or tingling in your hands and feet. Redness and burning of the tongue. Depression,  confusion, or memory problems. Trouble walking. If anemia is severe, symptoms can include: Shortness of breath. Dizziness. Rapid heart rate. How is this diagnosed? This condition may be diagnosed with a blood test to measure the level of vitamin B12 in your blood. You may also have other tests, including: A group of tests that measure certain characteristics of blood cells (complete blood count, CBC). A blood test to measure intrinsic factor. A procedure where a thin tube with a camera on the end is used to look into your stomach or intestines (endoscopy). Other tests may be needed to discover the cause of the deficiency. How is this treated? Treatment for this condition depends on the cause. This condition may be treated by: Changing your eating and drinking habits, such as: Eating more foods that contain vitamin B12. Drinking less alcohol or no alcohol. Getting vitamin B12 injections. Taking vitamin B12 supplements by mouth (orally). Your health care provider will tell you which dose is best for you. Follow these instructions at home: Eating and drinking  Include foods in your diet that come from animals and contain a lot of vitamin B12. These include: Meats and poultry. This includes beef, pork, chicken, turkey, and organ meats, such as liver. Seafood. This includes clams, rainbow trout, salmon, tuna, and haddock. Eggs. Dairy foods such as milk, yogurt, and cheese. Eat foods that have vitamin B12 added to them (are fortified), such as ready-to-eat breakfast cereals. Check the label on the package to see if a food is fortified. The items listed above may not be a complete list of foods and beverages you can eat and drink. Contact a dietitian for   more information. Alcohol use Do not drink alcohol if: Your health care provider tells you not to drink. You are pregnant, may be pregnant, or are planning to become pregnant. If you drink alcohol: Limit how much you have to: 0-1 drink a  day for women. 0-2 drinks a day for men. Know how much alcohol is in your drink. In the [place] one drink equals one 12 oz bottle of beer (355 mL), one 5 oz glass of wine (148 mL), or one 1 oz glass of hard liquor (44 mL). General instructions Get vitamin B12 injections if told to by your health care provider. Take supplements only as told by your health care provider. Follow the directions carefully. Keep all follow-up visits. This is important. Contact a health care provider if: Your symptoms come back. Your symptoms get worse or do not improve with treatment. Get help right away: You develop shortness of breath. You have a rapid heart rate. You have chest pain. You become dizzy or you faint. These symptoms may be an emergency. Get help right away. Call 911. Do not wait to see if the symptoms will go away. Do not drive yourself to the hospital. Summary Vitamin B12 deficiency occurs when the body does not have enough of this important vitamin. Common causes include not eating enough foods that contain vitamin B12, not being able to absorb vitamin B12 from the food that you eat, having a surgery in which part of the stomach or small intestine is removed, or taking certain medicines. Eat foods that have vitamin B12 in them. Treatment may include making a change in the way you eat and drink, getting vitamin B12 injections, or taking vitamin B12 supplements. This information is not intended to replace advice given to you by your health care provider. Make sure you discuss any questions you have with your health care provider. Document Revised: 09/03/2020 Document Reviewed: 09/03/2020 Elsevier Patient Education  2023 [employer]  

## 2021-09-06 ENCOUNTER — Telehealth: Payer: Self-pay | Admitting: Medical Oncology

## 2021-09-06 NOTE — Telephone Encounter (Signed)
Tested COVID +  8 days ago. She is symptom-free I told her to keep her appt on Thursday and to wear a mask.

## 2021-09-08 ENCOUNTER — Inpatient Hospital Stay: Payer: Commercial Managed Care - PPO | Attending: Internal Medicine

## 2021-09-08 ENCOUNTER — Other Ambulatory Visit: Payer: Self-pay

## 2021-09-08 DIAGNOSIS — D531 Other megaloblastic anemias, not elsewhere classified: Secondary | ICD-10-CM

## 2021-09-08 DIAGNOSIS — E538 Deficiency of other specified B group vitamins: Secondary | ICD-10-CM | POA: Diagnosis present

## 2021-09-08 DIAGNOSIS — D508 Other iron deficiency anemias: Secondary | ICD-10-CM

## 2021-09-08 MED ORDER — CYANOCOBALAMIN 1000 MCG/ML IJ SOLN
1000.0000 ug | Freq: Once | INTRAMUSCULAR | Status: AC
  Administered 2021-09-08: 1000 ug via INTRAMUSCULAR
  Filled 2021-09-08: qty 1

## 2021-09-08 NOTE — Patient Instructions (Signed)
Vitamin B12 Deficiency Vitamin B12 deficiency means that your body does not have enough vitamin B12. The body needs this important vitamin: To make red blood cells. To make genes (DNA). To help the nerves work. If you do not have enough vitamin B12 in your body, you can have health problems, such as not having enough red blood cells in the blood (anemia). What are the causes? Not eating enough foods that contain vitamin B12. Not being able to take in (absorb) vitamin B12 from the food that you eat. Certain diseases. A condition in which the body does not make enough of a certain protein. This results in your body not taking in enough vitamin B12. Having a surgery in which part of the stomach or small intestine is taken out. Taking medicines that make it hard for the body to take in vitamin B12. These include: Heartburn medicines. Some medicines that are used to treat diabetes. What increases the risk? Being an older adult. Eating a vegetarian or vegan diet that does not include any foods that come from animals. Not eating enough foods that contain vitamin B12 while you are pregnant. Taking certain medicines. Having alcoholism. What are the signs or symptoms? In some cases, there are no symptoms. If the condition leads to too few blood cells or nerve damage, symptoms can occur, such as: Feeling weak or tired. Not being hungry. Losing feeling (numbness) or tingling in your hands and feet. Redness and burning of the tongue. Feeling sad (depressed). Confusion or memory problems. Trouble walking. If anemia is very bad, symptoms can include: Being short of breath. Being dizzy. Having a very fast heartbeat. How is this treated? Changing the way you eat and drink, such as: Eating more foods that contain vitamin B12. Drinking little or no alcohol. Getting vitamin B12 shots. Taking vitamin B12 supplements by mouth (orally). Your doctor will tell you the dose that is best for you. Follow  these instructions at home: Eating and drinking  Eat foods that come from animals and have a lot of vitamin B12 in them. These include: Meats and poultry. This includes beef, pork, chicken, turkey, and organ meats, such as liver. Seafood, such as clams, rainbow trout, salmon, tuna, and haddock. Eggs. Dairy foods such as milk, yogurt, and cheese. Eat breakfast cereals that have vitamin B12 added to them (are fortified). Check the label. The items listed above may not be a complete list of foods and beverages you can eat and drink. Contact a dietitian for more information. Alcohol use Do not drink alcohol if: Your doctor tells you not to drink. You are pregnant, may be pregnant, or are planning to become pregnant. If you drink alcohol: Limit how much you have to: 0-1 drink a day for women. 0-2 drinks a day for men. Know how much alcohol is in your drink. In the [place] one drink equals one 12 oz bottle of beer (355 mL), one 5 oz glass of wine (148 mL), or one 1 oz glass of hard liquor (44 mL). General instructions Get any vitamin B12 shots if told by your doctor. Take supplements only as told by your doctor. Follow the directions. Keep all follow-up visits. Contact a doctor if: Your symptoms come back. Your symptoms get worse or do not get better with treatment. Get help right away if: You have trouble breathing. You have a very fast heartbeat. You have chest pain. You get dizzy. You faint. These symptoms may be an emergency. Get help right away. Call 911.   Do not wait to see if the symptoms will go away. Do not drive yourself to the hospital. Summary Vitamin B12 deficiency means that your body is not getting enough of the vitamin. In some cases, there are no symptoms of this condition. Treatment may include making a change in the way you eat and drink, getting shots, or taking supplements. Eat foods that have vitamin B12 in them. This information is not intended to replace advice  given to you by your health care provider. Make sure you discuss any questions you have with your health care provider. Document Revised: 09/03/2020 Document Reviewed: 09/03/2020 Elsevier Patient Education  2023 [employer] Inc[employer]  

## 2021-09-28 ENCOUNTER — Ambulatory Visit: Payer: Commercial Managed Care - PPO | Admitting: Pulmonary Disease

## 2021-10-07 ENCOUNTER — Other Ambulatory Visit: Payer: Self-pay

## 2021-10-07 ENCOUNTER — Inpatient Hospital Stay: Payer: Commercial Managed Care - PPO | Attending: Internal Medicine

## 2021-10-07 VITALS — BP 130/86 | HR 65 | Temp 98.2°F | Resp 16

## 2021-10-07 DIAGNOSIS — E538 Deficiency of other specified B group vitamins: Secondary | ICD-10-CM | POA: Diagnosis present

## 2021-10-07 DIAGNOSIS — D508 Other iron deficiency anemias: Secondary | ICD-10-CM

## 2021-10-07 DIAGNOSIS — D531 Other megaloblastic anemias, not elsewhere classified: Secondary | ICD-10-CM

## 2021-10-07 MED ORDER — CYANOCOBALAMIN 1000 MCG/ML IJ SOLN
1000.0000 ug | Freq: Once | INTRAMUSCULAR | Status: AC
  Administered 2021-10-07: 1000 ug via INTRAMUSCULAR
  Filled 2021-10-07: qty 1

## 2021-11-10 ENCOUNTER — Telehealth: Payer: Self-pay | Admitting: Physician Assistant

## 2021-11-10 NOTE — Telephone Encounter (Signed)
Rescheduled 11/16 appointment due to provider pal, called patient regarding rescheduled appointments. Left a voicemail.

## 2021-11-11 ENCOUNTER — Inpatient Hospital Stay: Payer: Commercial Managed Care - PPO | Attending: Internal Medicine

## 2021-11-11 DIAGNOSIS — D531 Other megaloblastic anemias, not elsewhere classified: Secondary | ICD-10-CM

## 2021-11-11 DIAGNOSIS — E538 Deficiency of other specified B group vitamins: Secondary | ICD-10-CM | POA: Insufficient documentation

## 2021-11-11 DIAGNOSIS — D508 Other iron deficiency anemias: Secondary | ICD-10-CM

## 2021-11-11 MED ORDER — CYANOCOBALAMIN 1000 MCG/ML IJ SOLN
1000.0000 ug | Freq: Once | INTRAMUSCULAR | Status: AC
  Administered 2021-11-11: 1000 ug via INTRAMUSCULAR
  Filled 2021-11-11: qty 1

## 2021-11-11 NOTE — Patient Instructions (Signed)
Vitamin B12 Deficiency Vitamin B12 deficiency occurs when the body does not have enough of this important vitamin. The body needs this vitamin: To make red blood cells. To make DNA. This is the genetic material inside cells. To help the nerves work properly so they can carry messages from the brain to the body. Vitamin B12 deficiency can cause health problems, such as not having enough red blood cells in the blood (anemia). This can lead to nerve damage if untreated. What are the causes? This condition may be caused by: Not eating enough foods that contain vitamin B12. Not having enough stomach acid and digestive fluids to properly absorb vitamin B12 from the food that you eat. Having certain diseases that make it hard to absorb vitamin B12. These diseases include Crohn's disease, chronic pancreatitis, and cystic fibrosis. An autoimmune disorder in which the body does not make enough of a protein (intrinsic factor) within the stomach, resulting in not enough absorption of vitamin B12. Having a surgery in which part of the stomach or small intestine is removed. Taking certain medicines that make it hard for the body to absorb vitamin B12. These include: Heartburn medicines, such as antacids and proton pump inhibitors. Some medicines that are used to treat diabetes. What increases the risk? The following factors may make you more likely to develop a vitamin B12 deficiency: Being an older adult. Eating a vegetarian or vegan diet that does not include any foods that come from animals. Eating a poor diet while you are pregnant. Taking certain medicines. Having alcoholism. What are the signs or symptoms? In some cases, there are no symptoms of this condition. If the condition leads to anemia or nerve damage, various symptoms may occur, such as: Weakness. Tiredness (fatigue). Loss of appetite. Numbness or tingling in your hands and feet. Redness and burning of the tongue. Depression,  confusion, or memory problems. Trouble walking. If anemia is severe, symptoms can include: Shortness of breath. Dizziness. Rapid heart rate. How is this diagnosed? This condition may be diagnosed with a blood test to measure the level of vitamin B12 in your blood. You may also have other tests, including: A group of tests that measure certain characteristics of blood cells (complete blood count, CBC). A blood test to measure intrinsic factor. A procedure where a thin tube with a camera on the end is used to look into your stomach or intestines (endoscopy). Other tests may be needed to discover the cause of the deficiency. How is this treated? Treatment for this condition depends on the cause. This condition may be treated by: Changing your eating and drinking habits, such as: Eating more foods that contain vitamin B12. Drinking less alcohol or no alcohol. Getting vitamin B12 injections. Taking vitamin B12 supplements by mouth (orally). Your health care provider will tell you which dose is best for you. Follow these instructions at home: Eating and drinking  Include foods in your diet that come from animals and contain a lot of vitamin B12. These include: Meats and poultry. This includes beef, pork, chicken, turkey, and organ meats, such as liver. Seafood. This includes clams, rainbow trout, salmon, tuna, and haddock. Eggs. Dairy foods such as milk, yogurt, and cheese. Eat foods that have vitamin B12 added to them (are fortified), such as ready-to-eat breakfast cereals. Check the label on the package to see if a food is fortified. The items listed above may not be a complete list of foods and beverages you can eat and drink. Contact a dietitian for   more information. Alcohol use Do not drink alcohol if: Your health care provider tells you not to drink. You are pregnant, may be pregnant, or are planning to become pregnant. If you drink alcohol: Limit how much you have to: 0-1 drink a  day for women. 0-2 drinks a day for men. Know how much alcohol is in your drink. In the [place] one drink equals one 12 oz bottle of beer (355 mL), one 5 oz glass of wine (148 mL), or one 1 oz glass of hard liquor (44 mL). General instructions Get vitamin B12 injections if told to by your health care provider. Take supplements only as told by your health care provider. Follow the directions carefully. Keep all follow-up visits. This is important. Contact a health care provider if: Your symptoms come back. Your symptoms get worse or do not improve with treatment. Get help right away: You develop shortness of breath. You have a rapid heart rate. You have chest pain. You become dizzy or you faint. These symptoms may be an emergency. Get help right away. Call 911. Do not wait to see if the symptoms will go away. Do not drive yourself to the hospital. Summary Vitamin B12 deficiency occurs when the body does not have enough of this important vitamin. Common causes include not eating enough foods that contain vitamin B12, not being able to absorb vitamin B12 from the food that you eat, having a surgery in which part of the stomach or small intestine is removed, or taking certain medicines. Eat foods that have vitamin B12 in them. Treatment may include making a change in the way you eat and drink, getting vitamin B12 injections, or taking vitamin B12 supplements. This information is not intended to replace advice given to you by your health care provider. Make sure you discuss any questions you have with your health care provider. Document Revised: 09/03/2020 Document Reviewed: 09/03/2020 Elsevier Patient Education  2023 [employer]  

## 2021-12-04 NOTE — Progress Notes (Unsigned)
[place] OFFICE PROGRESS NOTE  Scifres, Dorothy, PA-C (Inactive) No address on file  DIAGNOSIS:  History of persistent anemia secondary to malabsorption secondary to gastric bypass surgery in 2003   PRIOR THERAPY: none  CURRENT THERAPY: 1) Feraheme infusion 510 Mg IV PRN, last dose 09/25/20. 2) vitamin B12 injections monthly   INTERVAL HISTORY: Katie Salas 56 y.o. female returns to clinic today for 66-monthfollow-up visit.  The patient is being followed at the clinic for B12 and iron deficiency secondary to gastric bypass surgery in 2003.  The patient is receiving 12 injections B12 injections on a monthly basis.  She also receives IV iron as needed her most recent dose being on 09/25/2020.  Overall the patient is feeling well today.  Fatigue?  Denies any shortness of breath.  Denies any lightheadedness.  Denies any abnormal bleeding or bruising.  Taking any vitamin supplements p.o.?  She is here today for evaluation and repeat blood work.       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  MEDICATIONS:  Current Outpatient Medications  Medication Sig Dispense Refill   albuterol (VENTOLIN HFA) 108 (90 Base) MCG/ACT inhaler Inhale 1-2 puffs into the lungs every 6 (six) hours as needed for wheezing or shortness of breath. 18 g 0   budesonide-formoterol (SYMBICORT) 160-4.5 MCG/ACT inhaler Inhale 2 puffs into the lungs 2 (two) times daily. (Patient not taking: Reported on 06/24/2021) 1 each 2   pantoprazole (PROTONIX) 40 MG tablet Take 1 tablet (40 mg total) by mouth daily. 30 tablet 2   No current facility-administered medications for this visit.     SURGICAL HISTORY:  Past Surgical History:  Procedure Laterality Date   ABDOMINAL HYSTERECTOMY N/A 11/19/2013   Procedure: HYSTERECTOMY ABDOMINAL;  Surgeon: RMargarette Asal MD;  [facility];  Service: Gynecology;  Laterality: N/A;   ANKLE SURGERY     ligament reconstruction when patient  was 56years old   BREAST SURGERY     CESAREAN SECTION     GASTRIC BYPASS  01/23/2001   NECK SURGERY     cervical fusion    REVIEW OF SYSTEMS:   Review of Systems  Constitutional: Negative for appetite change, chills, fatigue, fever and unexpected weight change.  HENT:   Negative for mouth sores, nosebleeds, sore throat and trouble swallowing.   Eyes: Negative for eye problems and icterus.  Respiratory: Negative for cough, hemoptysis, shortness of breath and wheezing.   Cardiovascular: Negative for chest pain and leg swelling.  Gastrointestinal: Negative for abdominal pain, constipation, diarrhea, nausea and vomiting.  Genitourinary: Negative for bladder incontinence, difficulty urinating, dysuria, frequency and hematuria.   Musculoskeletal: Negative for back pain, gait problem, neck pain and neck stiffness.  Skin: Negative for itching and rash.  Neurological: Negative for dizziness, extremity weakness, gait problem, headaches, light-headedness and seizures.  Hematological: Negative for adenopathy. Does not bruise/bleed easily.  Psychiatric/Behavioral: Negative for confusion, depression and sleep disturbance. The patient is not nervous/anxious.     PHYSICAL EXAMINATION:  Last menstrual period 11/11/2013.  ECOG PERFORMANCE STATUS: {CHL ONC ECOG [id number] Physical Exam  Constitutional: Oriented to person, place, and time and well-developed, well-nourished, and  in no distress. No distress.  HENT:  Head: Normocephalic and atraumatic.  Mouth/Throat: Oropharynx is clear and moist. No oropharyngeal exudate.  Eyes: Conjunctivae are normal. Right eye exhibits no discharge. Left eye  exhibits no discharge. No scleral icterus.  Neck: Normal range of motion. Neck supple.  Cardiovascular: Normal rate, regular rhythm, normal heart sounds and intact distal pulses.   Pulmonary/Chest: Effort normal and breath sounds normal. No respiratory distress. No wheezes. No rales.  Abdominal: Soft. Bowel sounds are normal. Exhibits no distension and no mass. There is no tenderness.  Musculoskeletal: Normal range of motion. Exhibits no edema.  Lymphadenopathy:    No cervical adenopathy.  Neurological: Alert and oriented to person, place, and time. Exhibits normal muscle tone. Gait normal. Coordination normal.  Skin: Skin is warm and dry. No rash noted. Not diaphoretic. No erythema. No pallor.  Psychiatric: Mood, memory and judgment normal.  Vitals reviewed.  LABORATORY DATA: Lab Results  Component Value Date   WBC 5.9 06/09/2021   HGB 13.2 06/09/2021   HCT 40.3 06/09/2021   MCV 87.6 06/09/2021   PLT 243 06/09/2021      Chemistry      Component Value Date/Time   NA 141 09/07/2020 1108   NA 141 03/16/2015 0945   NA 140 11/27/2011 1515   K 4.1 09/07/2020 1108   K 3.8 11/27/2011 1515   CL 109 09/07/2020 1108   CL 109 (H) 11/27/2011 1515   CO2 26 09/07/2020 1108   CO2 27 11/27/2011 1515   BUN 10 09/07/2020 1108   BUN 12 03/16/2015 0945   BUN 14.0 11/27/2011 1515   CREATININE 0.71 09/07/2020 1108   CREATININE 0.6 11/27/2011 1515      Component Value Date/Time   CALCIUM 8.5 (L) 09/07/2020 1108   CALCIUM 8.5 11/27/2011 1515   ALKPHOS 105 09/07/2020 1108   ALKPHOS 160 (H) 11/27/2011 1515   AST 30 09/07/2020 1108   AST 28 11/27/2011 1515   ALT 28 09/07/2020 1108   ALT 25 11/27/2011 1515   BILITOT 1.0 09/07/2020 1108   BILITOT 0.81 11/27/2011 1515       RADIOGRAPHIC STUDIES:  No results found.   ASSESSMENT/PLAN:  This is a very pleasant 56 year old Caucasian female with with iron deficiency anemia and B12 deficiency secondary to malabsorption after gastric bypass  surgery in 2003.  The patient is receiving B12 injections on a monthly basis.  She also receives IV iron with Feraheme 510 mg IV as needed.  Her most recent being on 09/25/2020.  The patient had a repeat CBC, iron studies, ferritin, B12 drawn today.  Her hemoglobin is ***.  Her other labs are pending at this time.  She will receive her monthly B12 injection today as scheduled.  I will call her back if she is in need of a IV iron infusion with Feraheme based on her pending labs.  We will see her back for follow-up visit in 6 months for evaluation and repeat blood work.  In the meantime, she will continue getting B12 injections on a monthly basis.  The patient was advised to call immediately if she has any concerning symptoms in the interval. The patient voices understanding of current disease status and treatment options and is in agreement with the current care plan. All questions were answered. The patient knows to call the clinic with any problems, questions or concerns. We can certainly see the patient much sooner if necessary       No orders of the defined  types were placed in this encounter.    I spent {CHL ONC TIME VISIT - TWSFK:[id number]} counseling the patient face to face. The total time spent in the appointment was {CHL ONC TIME VISIT - VCBSW:[id number]}.  Chyan Carnero L Javeah Loeza, PA-C 12/04/21

## 2021-12-07 ENCOUNTER — Inpatient Hospital Stay (HOSPITAL_BASED_OUTPATIENT_CLINIC_OR_DEPARTMENT_OTHER): Payer: Commercial Managed Care - PPO | Admitting: Physician Assistant

## 2021-12-07 ENCOUNTER — Ambulatory Visit: Payer: Commercial Managed Care - PPO

## 2021-12-07 ENCOUNTER — Inpatient Hospital Stay: Payer: Commercial Managed Care - PPO | Attending: Internal Medicine

## 2021-12-07 ENCOUNTER — Other Ambulatory Visit: Payer: Self-pay

## 2021-12-07 ENCOUNTER — Telehealth: Payer: Self-pay | Admitting: Physician Assistant

## 2021-12-07 VITALS — BP 140/78 | HR 74 | Temp 97.8°F | Resp 18 | Ht 67.0 in | Wt 234.3 lb

## 2021-12-07 DIAGNOSIS — D508 Other iron deficiency anemias: Secondary | ICD-10-CM | POA: Diagnosis not present

## 2021-12-07 DIAGNOSIS — K912 Postsurgical malabsorption, not elsewhere classified: Secondary | ICD-10-CM | POA: Insufficient documentation

## 2021-12-07 DIAGNOSIS — D531 Other megaloblastic anemias, not elsewhere classified: Secondary | ICD-10-CM

## 2021-12-07 DIAGNOSIS — Z9884 Bariatric surgery status: Secondary | ICD-10-CM | POA: Diagnosis not present

## 2021-12-07 DIAGNOSIS — D509 Iron deficiency anemia, unspecified: Secondary | ICD-10-CM | POA: Diagnosis not present

## 2021-12-07 DIAGNOSIS — E538 Deficiency of other specified B group vitamins: Secondary | ICD-10-CM | POA: Diagnosis not present

## 2021-12-07 LAB — CBC WITH DIFFERENTIAL (CANCER CENTER ONLY)
Abs Immature Granulocytes: 0.01 10*3/uL (ref 0.00–0.07)
Basophils Absolute: 0 10*3/uL (ref 0.0–0.1)
Basophils Relative: 0 %
Eosinophils Absolute: 0.1 10*3/uL (ref 0.0–0.5)
Eosinophils Relative: 2 %
HCT: 39.3 % (ref 36.0–46.0)
Hemoglobin: 12.8 g/dL (ref 12.0–15.0)
Immature Granulocytes: 0 %
Lymphocytes Relative: 35 %
Lymphs Abs: 1.7 10*3/uL (ref 0.7–4.0)
MCH: 28.5 pg (ref 26.0–34.0)
MCHC: 32.6 g/dL (ref 30.0–36.0)
MCV: 87.5 fL (ref 80.0–100.0)
Monocytes Absolute: 0.4 10*3/uL (ref 0.1–1.0)
Monocytes Relative: 8 %
Neutro Abs: 2.7 10*3/uL (ref 1.7–7.7)
Neutrophils Relative %: 55 %
Platelet Count: 250 10*3/uL (ref 150–400)
RBC: 4.49 MIL/uL (ref 3.87–5.11)
RDW: 13.2 % (ref 11.5–15.5)
WBC Count: 4.8 10*3/uL (ref 4.0–10.5)
nRBC: 0 % (ref 0.0–0.2)

## 2021-12-07 LAB — IRON AND IRON BINDING CAPACITY (CC-WL,HP ONLY)
Iron: 73 ug/dL (ref 28–170)
Saturation Ratios: 18 % (ref 10.4–31.8)
TIBC: 396 ug/dL (ref 250–450)
UIBC: 323 ug/dL

## 2021-12-07 LAB — VITAMIN B12: Vitamin B-12: 379 pg/mL (ref 180–914)

## 2021-12-07 LAB — FERRITIN: Ferritin: 21 ng/mL (ref 11–307)

## 2021-12-07 LAB — FOLATE: Folate: 3.7 ng/mL — ABNORMAL LOW (ref >5.9)

## 2021-12-07 MED ORDER — CYANOCOBALAMIN 1000 MCG/ML IJ SOLN
1000.0000 ug | Freq: Once | INTRAMUSCULAR | Status: AC
  Administered 2021-12-07: 1000 ug via INTRAMUSCULAR

## 2021-12-08 ENCOUNTER — Encounter: Payer: Self-pay | Admitting: Physician Assistant

## 2021-12-08 ENCOUNTER — Other Ambulatory Visit: Payer: Commercial Managed Care - PPO

## 2021-12-08 ENCOUNTER — Ambulatory Visit: Payer: Commercial Managed Care - PPO | Admitting: Internal Medicine

## 2021-12-08 ENCOUNTER — Other Ambulatory Visit: Payer: Self-pay | Admitting: Physician Assistant

## 2021-12-08 DIAGNOSIS — E538 Deficiency of other specified B group vitamins: Secondary | ICD-10-CM

## 2021-12-08 MED ORDER — FOLIC ACID 1 MG PO TABS: 1.0000 mg | ORAL_TABLET | Freq: Every day | ORAL | 2 refills | Status: DC

## 2021-12-27 ENCOUNTER — Telehealth: Payer: Self-pay | Admitting: Internal Medicine

## 2021-12-27 NOTE — Telephone Encounter (Signed)
Called patient regarding upcoming appointments, patient is notified. 

## 2022-01-04 ENCOUNTER — Inpatient Hospital Stay: Payer: Commercial Managed Care - PPO

## 2022-01-04 ENCOUNTER — Other Ambulatory Visit: Payer: Commercial Managed Care - PPO

## 2022-01-05 ENCOUNTER — Other Ambulatory Visit: Payer: Self-pay

## 2022-01-05 ENCOUNTER — Inpatient Hospital Stay: Payer: Commercial Managed Care - PPO | Attending: Internal Medicine

## 2022-01-05 ENCOUNTER — Encounter: Payer: Self-pay | Admitting: Internal Medicine

## 2022-01-05 DIAGNOSIS — E538 Deficiency of other specified B group vitamins: Secondary | ICD-10-CM | POA: Diagnosis present

## 2022-01-05 DIAGNOSIS — D508 Other iron deficiency anemias: Secondary | ICD-10-CM

## 2022-01-05 DIAGNOSIS — D531 Other megaloblastic anemias, not elsewhere classified: Secondary | ICD-10-CM

## 2022-01-05 MED ORDER — CYANOCOBALAMIN 1000 MCG/ML IJ SOLN
1000.0000 ug | Freq: Once | INTRAMUSCULAR | Status: AC
  Administered 2022-01-05: 1000 ug via INTRAMUSCULAR
  Filled 2022-01-05: qty 1

## 2022-01-05 NOTE — Telephone Encounter (Signed)
Error

## 2022-01-05 NOTE — Patient Instructions (Signed)
Vitamin B12 Deficiency Vitamin B12 deficiency occurs when the body does not have enough of this important vitamin. The body needs this vitamin: To make red blood cells. To make DNA. This is the genetic material inside cells. To help the nerves work properly so they can carry messages from the brain to the body. Vitamin B12 deficiency can cause health problems, such as not having enough red blood cells in the blood (anemia). This can lead to nerve damage if untreated. What are the causes? This condition may be caused by: Not eating enough foods that contain vitamin B12. Not having enough stomach acid and digestive fluids to properly absorb vitamin B12 from the food that you eat. Having certain diseases that make it hard to absorb vitamin B12. These diseases include Crohn's disease, chronic pancreatitis, and cystic fibrosis. An autoimmune disorder in which the body does not make enough of a protein (intrinsic factor) within the stomach, resulting in not enough absorption of vitamin B12. Having a surgery in which part of the stomach or small intestine is removed. Taking certain medicines that make it hard for the body to absorb vitamin B12. These include: Heartburn medicines, such as antacids and proton pump inhibitors. Some medicines that are used to treat diabetes. What increases the risk? The following factors may make you more likely to develop a vitamin B12 deficiency: Being an older adult. Eating a vegetarian or vegan diet that does not include any foods that come from animals. Eating a poor diet while you are pregnant. Taking certain medicines. Having alcoholism. What are the signs or symptoms? In some cases, there are no symptoms of this condition. If the condition leads to anemia or nerve damage, various symptoms may occur, such as: Weakness. Tiredness (fatigue). Loss of appetite. Numbness or tingling in your hands and feet. Redness and burning of the tongue. Depression,  confusion, or memory problems. Trouble walking. If anemia is severe, symptoms can include: Shortness of breath. Dizziness. Rapid heart rate. How is this diagnosed? This condition may be diagnosed with a blood test to measure the level of vitamin B12 in your blood. You may also have other tests, including: A group of tests that measure certain characteristics of blood cells (complete blood count, CBC). A blood test to measure intrinsic factor. A procedure where a thin tube with a camera on the end is used to look into your stomach or intestines (endoscopy). Other tests may be needed to discover the cause of the deficiency. How is this treated? Treatment for this condition depends on the cause. This condition may be treated by: Changing your eating and drinking habits, such as: Eating more foods that contain vitamin B12. Drinking less alcohol or no alcohol. Getting vitamin B12 injections. Taking vitamin B12 supplements by mouth (orally). Your health care provider will tell you which dose is best for you. Follow these instructions at home: Eating and drinking  Include foods in your diet that come from animals and contain a lot of vitamin B12. These include: Meats and poultry. This includes beef, pork, chicken, turkey, and organ meats, such as liver. Seafood. This includes clams, rainbow trout, salmon, tuna, and haddock. Eggs. Dairy foods such as milk, yogurt, and cheese. Eat foods that have vitamin B12 added to them (are fortified), such as ready-to-eat breakfast cereals. Check the label on the package to see if a food is fortified. The items listed above may not be a complete list of foods and beverages you can eat and drink. Contact a dietitian for   more information. Alcohol use Do not drink alcohol if: Your health care provider tells you not to drink. You are pregnant, may be pregnant, or are planning to become pregnant. If you drink alcohol: Limit how much you have to: 0-1 drink a  day for women. 0-2 drinks a day for men. Know how much alcohol is in your drink. In the [place] one drink equals one 12 oz bottle of beer (355 mL), one 5 oz glass of wine (148 mL), or one 1 oz glass of hard liquor (44 mL). General instructions Get vitamin B12 injections if told to by your health care provider. Take supplements only as told by your health care provider. Follow the directions carefully. Keep all follow-up visits. This is important. Contact a health care provider if: Your symptoms come back. Your symptoms get worse or do not improve with treatment. Get help right away: You develop shortness of breath. You have a rapid heart rate. You have chest pain. You become dizzy or you faint. These symptoms may be an emergency. Get help right away. Call 911. Do not wait to see if the symptoms will go away. Do not drive yourself to the hospital. Summary Vitamin B12 deficiency occurs when the body does not have enough of this important vitamin. Common causes include not eating enough foods that contain vitamin B12, not being able to absorb vitamin B12 from the food that you eat, having a surgery in which part of the stomach or small intestine is removed, or taking certain medicines. Eat foods that have vitamin B12 in them. Treatment may include making a change in the way you eat and drink, getting vitamin B12 injections, or taking vitamin B12 supplements. This information is not intended to replace advice given to you by your health care provider. Make sure you discuss any questions you have with your health care provider. Document Revised: 09/03/2020 Document Reviewed: 09/03/2020 Elsevier Patient Education  2023 [employer]  

## 2022-02-08 ENCOUNTER — Other Ambulatory Visit: Payer: Commercial Managed Care - PPO

## 2022-02-08 ENCOUNTER — Inpatient Hospital Stay: Payer: Commercial Managed Care - PPO | Attending: Internal Medicine

## 2022-02-08 DIAGNOSIS — D508 Other iron deficiency anemias: Secondary | ICD-10-CM

## 2022-02-08 DIAGNOSIS — E538 Deficiency of other specified B group vitamins: Secondary | ICD-10-CM | POA: Diagnosis present

## 2022-02-08 DIAGNOSIS — D531 Other megaloblastic anemias, not elsewhere classified: Secondary | ICD-10-CM

## 2022-02-08 MED ORDER — CYANOCOBALAMIN 1000 MCG/ML IJ SOLN
1000.0000 ug | Freq: Once | INTRAMUSCULAR | Status: AC
  Administered 2022-02-08: 1000 ug via INTRAMUSCULAR
  Filled 2022-02-08: qty 1

## 2022-02-27 ENCOUNTER — Other Ambulatory Visit: Payer: Self-pay | Admitting: Physician Assistant

## 2022-02-27 DIAGNOSIS — E538 Deficiency of other specified B group vitamins: Secondary | ICD-10-CM

## 2022-03-08 ENCOUNTER — Inpatient Hospital Stay: Payer: Commercial Managed Care - PPO | Attending: Internal Medicine

## 2022-03-08 ENCOUNTER — Other Ambulatory Visit: Payer: Self-pay | Admitting: Physician Assistant

## 2022-03-08 ENCOUNTER — Other Ambulatory Visit: Payer: Self-pay

## 2022-03-08 ENCOUNTER — Other Ambulatory Visit: Payer: Commercial Managed Care - PPO

## 2022-03-08 DIAGNOSIS — E538 Deficiency of other specified B group vitamins: Secondary | ICD-10-CM | POA: Diagnosis present

## 2022-03-08 DIAGNOSIS — D508 Other iron deficiency anemias: Secondary | ICD-10-CM

## 2022-03-08 DIAGNOSIS — D531 Other megaloblastic anemias, not elsewhere classified: Secondary | ICD-10-CM

## 2022-03-08 MED ORDER — CYANOCOBALAMIN 1000 MCG/ML IJ SOLN
1000.0000 ug | Freq: Once | INTRAMUSCULAR | Status: AC
  Administered 2022-03-08: 1000 ug via INTRAMUSCULAR
  Filled 2022-03-08: qty 1

## 2022-03-08 NOTE — Patient Instructions (Signed)
Vitamin B12 Deficiency Vitamin B12 deficiency occurs when the body does not have enough of this important vitamin. The body needs this vitamin: To make red blood cells. To make DNA. This is the genetic material inside cells. To help the nerves work properly so they can carry messages from the brain to the body. Vitamin B12 deficiency can cause health problems, such as not having enough red blood cells in the blood (anemia). This can lead to nerve damage if untreated. What are the causes? This condition may be caused by: Not eating enough foods that contain vitamin B12. Not having enough stomach acid and digestive fluids to properly absorb vitamin B12 from the food that you eat. Having certain diseases that make it hard to absorb vitamin B12. These diseases include Crohn's disease, chronic pancreatitis, and cystic fibrosis. An autoimmune disorder in which the body does not make enough of a protein (intrinsic factor) within the stomach, resulting in not enough absorption of vitamin B12. Having a surgery in which part of the stomach or small intestine is removed. Taking certain medicines that make it hard for the body to absorb vitamin B12. These include: Heartburn medicines, such as antacids and proton pump inhibitors. Some medicines that are used to treat diabetes. What increases the risk? The following factors may make you more likely to develop a vitamin B12 deficiency: Being an older adult. Eating a vegetarian or vegan diet that does not include any foods that come from animals. Eating a poor diet while you are pregnant. Taking certain medicines. Having alcoholism. What are the signs or symptoms? In some cases, there are no symptoms of this condition. If the condition leads to anemia or nerve damage, various symptoms may occur, such as: Weakness. Tiredness (fatigue). Loss of appetite. Numbness or tingling in your hands and feet. Redness and burning of the tongue. Depression,  confusion, or memory problems. Trouble walking. If anemia is severe, symptoms can include: Shortness of breath. Dizziness. Rapid heart rate. How is this diagnosed? This condition may be diagnosed with a blood test to measure the level of vitamin B12 in your blood. You may also have other tests, including: A group of tests that measure certain characteristics of blood cells (complete blood count, CBC). A blood test to measure intrinsic factor. A procedure where a thin tube with a camera on the end is used to look into your stomach or intestines (endoscopy). Other tests may be needed to discover the cause of the deficiency. How is this treated? Treatment for this condition depends on the cause. This condition may be treated by: Changing your eating and drinking habits, such as: Eating more foods that contain vitamin B12. Drinking less alcohol or no alcohol. Getting vitamin B12 injections. Taking vitamin B12 supplements by mouth (orally). Your health care provider will tell you which dose is best for you. Follow these instructions at home: Eating and drinking  Include foods in your diet that come from animals and contain a lot of vitamin B12. These include: Meats and poultry. This includes beef, pork, chicken, turkey, and organ meats, such as liver. Seafood. This includes clams, rainbow trout, salmon, tuna, and haddock. Eggs. Dairy foods such as milk, yogurt, and cheese. Eat foods that have vitamin B12 added to them (are fortified), such as ready-to-eat breakfast cereals. Check the label on the package to see if a food is fortified. The items listed above may not be a complete list of foods and beverages you can eat and drink. Contact a dietitian for   more information. Alcohol use Do not drink alcohol if: Your health care provider tells you not to drink. You are pregnant, may be pregnant, or are planning to become pregnant. If you drink alcohol: Limit how much you have to: 0-1 drink a  day for women. 0-2 drinks a day for men. Know how much alcohol is in your drink. In the [place] one drink equals one 12 oz bottle of beer (355 mL), one 5 oz glass of wine (148 mL), or one 1 oz glass of hard liquor (44 mL). General instructions Get vitamin B12 injections if told to by your health care provider. Take supplements only as told by your health care provider. Follow the directions carefully. Keep all follow-up visits. This is important. Contact a health care provider if: Your symptoms come back. Your symptoms get worse or do not improve with treatment. Get help right away: You develop shortness of breath. You have a rapid heart rate. You have chest pain. You become dizzy or you faint. These symptoms may be an emergency. Get help right away. Call 911. Do not wait to see if the symptoms will go away. Do not drive yourself to the hospital. Summary Vitamin B12 deficiency occurs when the body does not have enough of this important vitamin. Common causes include not eating enough foods that contain vitamin B12, not being able to absorb vitamin B12 from the food that you eat, having a surgery in which part of the stomach or small intestine is removed, or taking certain medicines. Eat foods that have vitamin B12 in them. Treatment may include making a change in the way you eat and drink, getting vitamin B12 injections, or taking vitamin B12 supplements. This information is not intended to replace advice given to you by your health care provider. Make sure you discuss any questions you have with your health care provider. Document Revised: 09/03/2020 Document Reviewed: 09/03/2020 Elsevier Patient Education  2023 [employer]  

## 2022-04-05 ENCOUNTER — Inpatient Hospital Stay: Payer: Commercial Managed Care - PPO | Attending: Internal Medicine

## 2022-04-05 ENCOUNTER — Other Ambulatory Visit: Payer: Commercial Managed Care - PPO

## 2022-04-05 DIAGNOSIS — E538 Deficiency of other specified B group vitamins: Secondary | ICD-10-CM | POA: Diagnosis not present

## 2022-04-05 DIAGNOSIS — D508 Other iron deficiency anemias: Secondary | ICD-10-CM

## 2022-04-05 DIAGNOSIS — D531 Other megaloblastic anemias, not elsewhere classified: Secondary | ICD-10-CM

## 2022-04-05 MED ORDER — CYANOCOBALAMIN 1000 MCG/ML IJ SOLN
1000.0000 ug | Freq: Once | INTRAMUSCULAR | Status: AC
  Administered 2022-04-05: 1000 ug via INTRAMUSCULAR
  Filled 2022-04-05: qty 1

## 2022-05-10 ENCOUNTER — Other Ambulatory Visit: Payer: Self-pay

## 2022-05-10 ENCOUNTER — Inpatient Hospital Stay: Payer: Commercial Managed Care - PPO | Attending: Internal Medicine

## 2022-05-10 ENCOUNTER — Other Ambulatory Visit: Payer: Commercial Managed Care - PPO

## 2022-05-10 VITALS — BP 147/79 | HR 63 | Temp 98.3°F | Resp 18

## 2022-05-10 DIAGNOSIS — D531 Other megaloblastic anemias, not elsewhere classified: Secondary | ICD-10-CM

## 2022-05-10 DIAGNOSIS — E538 Deficiency of other specified B group vitamins: Secondary | ICD-10-CM | POA: Insufficient documentation

## 2022-05-10 DIAGNOSIS — D508 Other iron deficiency anemias: Secondary | ICD-10-CM

## 2022-05-10 MED ORDER — CYANOCOBALAMIN 1000 MCG/ML IJ SOLN
1000.0000 ug | Freq: Once | INTRAMUSCULAR | Status: AC
  Administered 2022-05-10: 1000 ug via INTRAMUSCULAR
  Filled 2022-05-10: qty 1

## 2022-06-04 ENCOUNTER — Other Ambulatory Visit: Payer: Self-pay | Admitting: Physician Assistant

## 2022-06-04 DIAGNOSIS — E538 Deficiency of other specified B group vitamins: Secondary | ICD-10-CM

## 2022-06-07 ENCOUNTER — Inpatient Hospital Stay (HOSPITAL_BASED_OUTPATIENT_CLINIC_OR_DEPARTMENT_OTHER): Payer: Commercial Managed Care - PPO | Admitting: Internal Medicine

## 2022-06-07 ENCOUNTER — Other Ambulatory Visit: Payer: Self-pay

## 2022-06-07 ENCOUNTER — Inpatient Hospital Stay: Payer: Commercial Managed Care - PPO

## 2022-06-07 ENCOUNTER — Inpatient Hospital Stay: Payer: Commercial Managed Care - PPO | Attending: Internal Medicine

## 2022-06-07 VITALS — BP 138/92 | HR 79 | Temp 98.1°F | Resp 17 | Wt 230.1 lb

## 2022-06-07 DIAGNOSIS — D508 Other iron deficiency anemias: Secondary | ICD-10-CM

## 2022-06-07 DIAGNOSIS — D509 Iron deficiency anemia, unspecified: Secondary | ICD-10-CM

## 2022-06-07 DIAGNOSIS — E538 Deficiency of other specified B group vitamins: Secondary | ICD-10-CM | POA: Insufficient documentation

## 2022-06-07 DIAGNOSIS — J4520 Mild intermittent asthma, uncomplicated: Secondary | ICD-10-CM

## 2022-06-07 DIAGNOSIS — D531 Other megaloblastic anemias, not elsewhere classified: Secondary | ICD-10-CM

## 2022-06-07 DIAGNOSIS — Z9884 Bariatric surgery status: Secondary | ICD-10-CM | POA: Insufficient documentation

## 2022-06-07 LAB — CBC WITH DIFFERENTIAL (CANCER CENTER ONLY)
Abs Immature Granulocytes: 0.01 10*3/uL (ref 0.00–0.07)
Basophils Absolute: 0 10*3/uL (ref 0.0–0.1)
Basophils Relative: 0 %
Eosinophils Absolute: 0.1 10*3/uL (ref 0.0–0.5)
Eosinophils Relative: 2 %
HCT: 38.8 % (ref 36.0–46.0)
Hemoglobin: 12.5 g/dL (ref 12.0–15.0)
Immature Granulocytes: 0 %
Lymphocytes Relative: 34 %
Lymphs Abs: 1.6 10*3/uL (ref 0.7–4.0)
MCH: 28.1 pg (ref 26.0–34.0)
MCHC: 32.2 g/dL (ref 30.0–36.0)
MCV: 87.2 fL (ref 80.0–100.0)
Monocytes Absolute: 0.3 10*3/uL (ref 0.1–1.0)
Monocytes Relative: 7 %
Neutro Abs: 2.7 10*3/uL (ref 1.7–7.7)
Neutrophils Relative %: 57 %
Platelet Count: 235 10*3/uL (ref 150–400)
RBC: 4.45 MIL/uL (ref 3.87–5.11)
RDW: 12.7 % (ref 11.5–15.5)
WBC Count: 4.7 10*3/uL (ref 4.0–10.5)
nRBC: 0 % (ref 0.0–0.2)

## 2022-06-07 LAB — IRON AND IRON BINDING CAPACITY (CC-WL,HP ONLY)
Iron: 42 ug/dL (ref 28–170)
Saturation Ratios: 11 % (ref 10.4–31.8)
TIBC: 377 ug/dL (ref 250–450)
UIBC: 335 ug/dL (ref 148–442)

## 2022-06-07 LAB — VITAMIN B12: Vitamin B-12: 431 pg/mL (ref 180–914)

## 2022-06-07 LAB — FERRITIN: Ferritin: 12 ng/mL (ref 11–307)

## 2022-06-07 MED ORDER — CYANOCOBALAMIN 1000 MCG/ML IJ SOLN
1000.0000 ug | Freq: Once | INTRAMUSCULAR | Status: AC
  Administered 2022-06-07: 1000 ug via INTRAMUSCULAR
  Filled 2022-06-07: qty 1

## 2022-06-07 NOTE — Progress Notes (Signed)
[facility] [phone]   Fax:([phone]  OFFICE PROGRESS NOTE  Scifres, Nicole Cella, PA-C (Inactive) No address on file  DIAGNOSIS: history of persistent anemia secondary to malabsorption secondary to gastric bypass surgery in 2003  PRIOR THERAPY:None.  CURRENT THERAPY: 1) Feraheme infusion 510 Mg IV weekly for 2 weeks. 2) vitamin B12 injection weekly for 4 weeks followed by monthly injection.  INTERVAL HISTORY: Katie Salas 57 y.o. female returns to the clinic today for follow-up visit.  The patient is feeling fine today with no concerning complaints except for mild fatigue.  She recently underwent the first radiofrequency ablation at [facility] for Barrett's esophagus that was diagnosed by Dr. Elnoria Howard.  She denied having any nausea, vomiting, diarrhea or constipation.  She has occasional chest pain with no significant shortness of breath, cough or hemoptysis.  She has no dizzy spells.  She is here today for evaluation and repeat blood work.  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  MEDICATIONS:  Current Outpatient Medications  Medication Sig Dispense Refill   albuterol (VENTOLIN HFA) 108 (90 Base) MCG/ACT inhaler Inhale 1-2 puffs into the lungs every 6 (six) hours as needed for wheezing or shortness of breath. 18 g 0   folic acid (FOLVITE) 1 MG tablet TAKE 1 TABLET(1 MG) BY MOUTH DAILY 30 tablet 2   Na Sulfate-K Sulfate-Mg Sulf 17.5-3.13-1.6 GM/177ML SOLN Take by mouth as directed.     pantoprazole (PROTONIX) 40 MG tablet Take 1 tablet (40 mg total) by mouth daily. 30 tablet 2   No current facility-administered  medications for this visit.    SURGICAL HISTORY:  Past Surgical History:  Procedure Laterality Date   ABDOMINAL HYSTERECTOMY N/A 11/19/2013   Procedure: HYSTERECTOMY ABDOMINAL;  Surgeon: Meriel Pica, MD;  Location: [facility];  Service: Gynecology;  Laterality: N/A;   ANKLE SURGERY     ligament reconstruction when patient  was 57 years old   BREAST SURGERY     CESAREAN SECTION     GASTRIC BYPASS  01/23/2001   NECK SURGERY     cervical fusion    REVIEW OF SYSTEMS:  A comprehensive review of systems was negative except for: Constitutional: positive for fatigue Respiratory: positive for pleurisy/chest pain   PHYSICAL EXAMINATION: General appearance: alert, cooperative, fatigued, and no distress Head: Normocephalic, without obvious abnormality, atraumatic Neck: no adenopathy, no JVD, supple, symmetrical, trachea midline, and thyroid not enlarged, symmetric, no tenderness/mass/nodules Lymph nodes: Cervical, supraclavicular, and axillary nodes normal. Resp: clear to auscultation bilaterally Back: symmetric, no curvature. ROM normal. No CVA tenderness. Cardio: regular rate and rhythm, S1, S2 normal, no murmur, click, rub or gallop GI: soft, non-tender; bowel sounds normal; no masses,  no organomegaly Extremities: extremities normal, atraumatic, no cyanosis or edema  ECOG PERFORMANCE STATUS: 0 - Asymptomatic  Blood pressure (!) 138/92, pulse 79, temperature 98.1 F (36.7 C), temperature source Oral, resp. rate 17, weight 230 lb 1.6 oz (104.4 kg), last menstrual period 11/11/2013, SpO2 99 %.  LABORATORY DATA: Lab Results  Component Value Date   WBC 4.7 06/07/2022   HGB 12.5 06/07/2022   HCT 38.8 06/07/2022   MCV  87.2 06/07/2022   PLT 235 06/07/2022      Chemistry      Component Value Date/Time   NA 141 09/07/2020 1108   NA 141 03/16/2015 0945   NA 140 11/27/2011 1515   K 4.1 09/07/2020 1108   K 3.8 11/27/2011 1515   CL 109 09/07/2020 1108   CL 109 (H) 11/27/2011 1515    CO2 26 09/07/2020 1108   CO2 27 11/27/2011 1515   BUN 10 09/07/2020 1108   BUN 12 03/16/2015 0945   BUN 14.0 11/27/2011 1515   CREATININE 0.71 09/07/2020 1108   CREATININE 0.6 11/27/2011 1515      Component Value Date/Time   CALCIUM 8.5 (L) 09/07/2020 1108   CALCIUM 8.5 11/27/2011 1515   ALKPHOS 105 09/07/2020 1108   ALKPHOS 160 (H) 11/27/2011 1515   AST 30 09/07/2020 1108   AST 28 11/27/2011 1515   ALT 28 09/07/2020 1108   ALT 25 11/27/2011 1515   BILITOT 1.0 09/07/2020 1108   BILITOT 0.81 11/27/2011 1515       RADIOGRAPHIC STUDIES: No results found.  ASSESSMENT AND PLAN: This is a very pleasant 57 years old white female with iron deficiency anemia secondary to malabsorption issue after gastric bypass surgery.  The patient also has vitamin B12 deficiency. She was treated with iron infusion with Feraheme 510 Mg IV weekly for 2 weeks in August 2022 in addition to vitamin B12 injections.  She tolerated her treatment fairly well. The patient is currently on observation and she is feeling fine with no concerning complaints except for mild fatigue. Repeat CBC today is unremarkable but iron study, vitamin B12 and ferritin are still pending. I recommended for her to continue on observation with repeat blood work in 6 months unless there is significant iron deficiency, I will consider the patient for iron infusion in the interval. She was advised to call immediately if she has any other concerning symptoms in the interval. The patient voices understanding of current disease status and treatment options and is in agreement with the current care plan.  All questions were answered. The patient knows to call the clinic with any problems, questions or concerns. We can certainly see the patient much sooner if necessary.  The total time spent in the appointment was 20 minutes.  Disclaimer: This note was dictated with voice recognition software. Similar sounding words can inadvertently be  transcribed and may not be corrected upon review.

## 2022-08-02 ENCOUNTER — Encounter (INDEPENDENT_AMBULATORY_CARE_PROVIDER_SITE_OTHER): Payer: Commercial Managed Care - PPO | Admitting: Ophthalmology

## 2022-08-02 DIAGNOSIS — B399 Histoplasmosis, unspecified: Secondary | ICD-10-CM

## 2022-08-02 DIAGNOSIS — H32 Chorioretinal disorders in diseases classified elsewhere: Secondary | ICD-10-CM

## 2022-08-02 DIAGNOSIS — H2512 Age-related nuclear cataract, left eye: Secondary | ICD-10-CM

## 2022-08-02 DIAGNOSIS — H43813 Vitreous degeneration, bilateral: Secondary | ICD-10-CM | POA: Diagnosis not present

## 2022-09-08 ENCOUNTER — Other Ambulatory Visit: Payer: Self-pay | Admitting: Physician Assistant

## 2022-09-08 DIAGNOSIS — E538 Deficiency of other specified B group vitamins: Secondary | ICD-10-CM

## 2022-12-06 ENCOUNTER — Inpatient Hospital Stay: Payer: Commercial Managed Care - PPO

## 2022-12-06 ENCOUNTER — Inpatient Hospital Stay: Payer: Commercial Managed Care - PPO | Admitting: Internal Medicine

## 2022-12-11 ENCOUNTER — Other Ambulatory Visit: Payer: Self-pay | Admitting: Physician Assistant

## 2022-12-11 DIAGNOSIS — E538 Deficiency of other specified B group vitamins: Secondary | ICD-10-CM

## 2022-12-28 ENCOUNTER — Other Ambulatory Visit: Payer: Commercial Managed Care - PPO

## 2022-12-28 ENCOUNTER — Ambulatory Visit: Payer: Commercial Managed Care - PPO | Admitting: Internal Medicine

## 2023-01-15 ENCOUNTER — Other Ambulatory Visit: Payer: Self-pay | Admitting: Internal Medicine

## 2023-01-15 ENCOUNTER — Inpatient Hospital Stay: Payer: Commercial Managed Care - PPO | Attending: Internal Medicine | Admitting: Internal Medicine

## 2023-01-15 ENCOUNTER — Inpatient Hospital Stay: Payer: Commercial Managed Care - PPO

## 2023-01-15 VITALS — BP 144/91 | HR 66 | Temp 97.5°F | Resp 17 | Ht 67.0 in | Wt 232.2 lb

## 2023-01-15 DIAGNOSIS — D508 Other iron deficiency anemias: Secondary | ICD-10-CM | POA: Insufficient documentation

## 2023-01-15 DIAGNOSIS — D531 Other megaloblastic anemias, not elsewhere classified: Secondary | ICD-10-CM

## 2023-01-15 DIAGNOSIS — K22710 Barrett's esophagus with low grade dysplasia: Secondary | ICD-10-CM | POA: Insufficient documentation

## 2023-01-15 DIAGNOSIS — R5383 Other fatigue: Secondary | ICD-10-CM | POA: Insufficient documentation

## 2023-01-15 DIAGNOSIS — R0602 Shortness of breath: Secondary | ICD-10-CM | POA: Diagnosis not present

## 2023-01-15 DIAGNOSIS — Z9884 Bariatric surgery status: Secondary | ICD-10-CM | POA: Insufficient documentation

## 2023-01-15 DIAGNOSIS — E538 Deficiency of other specified B group vitamins: Secondary | ICD-10-CM | POA: Diagnosis not present

## 2023-01-15 DIAGNOSIS — K909 Intestinal malabsorption, unspecified: Secondary | ICD-10-CM | POA: Insufficient documentation

## 2023-01-15 LAB — CBC WITH DIFFERENTIAL (CANCER CENTER ONLY)
Abs Immature Granulocytes: 0.01 10*3/uL (ref 0.00–0.07)
Basophils Absolute: 0 10*3/uL (ref 0.0–0.1)
Basophils Relative: 1 %
Eosinophils Absolute: 0.1 10*3/uL (ref 0.0–0.5)
Eosinophils Relative: 1 %
HCT: 39.1 % (ref 36.0–46.0)
Hemoglobin: 12.3 g/dL (ref 12.0–15.0)
Immature Granulocytes: 0 %
Lymphocytes Relative: 28 %
Lymphs Abs: 1.5 10*3/uL (ref 0.7–4.0)
MCH: 27.1 pg (ref 26.0–34.0)
MCHC: 31.5 g/dL (ref 30.0–36.0)
MCV: 86.1 fL (ref 80.0–100.0)
Monocytes Absolute: 0.3 10*3/uL (ref 0.1–1.0)
Monocytes Relative: 6 %
Neutro Abs: 3.3 10*3/uL (ref 1.7–7.7)
Neutrophils Relative %: 64 %
Platelet Count: 257 10*3/uL (ref 150–400)
RBC: 4.54 MIL/uL (ref 3.87–5.11)
RDW: 13.2 % (ref 11.5–15.5)
WBC Count: 5.2 10*3/uL (ref 4.0–10.5)
nRBC: 0 % (ref 0.0–0.2)

## 2023-01-15 LAB — FERRITIN: Ferritin: 8 ng/mL — ABNORMAL LOW (ref 11–307)

## 2023-01-15 LAB — FOLATE: Folate: 11.8 ng/mL (ref >5.9)

## 2023-01-15 LAB — IRON AND IRON BINDING CAPACITY (CC-WL,HP ONLY)
Iron: 62 ug/dL (ref 28–170)
Saturation Ratios: 14 % (ref 10.4–31.8)
TIBC: 454 ug/dL — ABNORMAL HIGH (ref 250–450)
UIBC: 392 ug/dL (ref 148–442)

## 2023-01-15 LAB — VITAMIN B12: Vitamin B-12: 287 pg/mL (ref 180–914)

## 2023-01-15 NOTE — Progress Notes (Signed)
[facility] [phone]   Fax:([phone]  OFFICE PROGRESS NOTE  Scifres, Nicole Cella, PA-C (Inactive) No address on file  DIAGNOSIS: history of persistent anemia secondary to malabsorption secondary to gastric bypass surgery in 2003  PRIOR THERAPY:None.  CURRENT THERAPY: 1) Feraheme infusion 510 Mg IV weekly for 2 weeks. 2) vitamin B12 injection weekly for 4 weeks followed by monthly injection.  INTERVAL HISTORY: Katie Salas 57 y.o. female returns to the clinic today for 1-month follow-up visit.Discussed the use of AI scribe software for clinical note transcription with the patient, who gave verbal consent to proceed.  History of Present Illness   Katie Salas, a 57 year old patient with a history of gastric bypass surgery, iron deficiency anemia, and vitamin B12 deficiency due to malabsorption, presents with increased fatigue and occasional dizziness upon standing. The patient also reports experiencing shortness of breath, particularly when climbing stairs, which is a new development. There are no complaints of nausea, vomiting, diarrhea, or blood in the stool.  The patient has previously been treated with Feraheme infusions for iron deficiency and regular B12 injections. However, the B12 injections have not been administered for several months due to a scheduling issue. The patient is also being treated for Barrett's esophagus with low-grade dysplasia and has undergone several radiofrequency ablation (RFA) treatments at [facility].        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  MEDICATIONS:  Current Outpatient  Medications  Medication Sig Dispense Refill   albuterol (VENTOLIN HFA) 108 (90 Base) MCG/ACT inhaler Inhale 1-2 puffs into the lungs every 6 (six) hours as needed for wheezing or shortness of breath. 18 g 0   folic acid (FOLVITE) 1 MG tablet TAKE 1 TABLET(1 MG) BY MOUTH DAILY 30 tablet 2   Na Sulfate-K Sulfate-Mg Sulf 17.5-3.13-1.6 GM/177ML SOLN Take by mouth as directed.     pantoprazole (PROTONIX) 40 MG tablet Take 1 tablet (40 mg total) by mouth daily. 30 tablet 2   No current facility-administered medications for this visit.    SURGICAL HISTORY:  Past Surgical History:  Procedure Laterality Date   ABDOMINAL HYSTERECTOMY N/A 11/19/2013   Procedure: HYSTERECTOMY ABDOMINAL;  Surgeon: Meriel Pica, MD;  Location: [facility];  Service: Gynecology;  Laterality: N/A;   ANKLE SURGERY     ligament reconstruction when patient  was 57 years old   BREAST SURGERY     CESAREAN SECTION     GASTRIC BYPASS  01/23/2001   NECK SURGERY     cervical fusion    REVIEW OF SYSTEMS:  A comprehensive review of systems was negative except for: Constitutional: positive for fatigue Respiratory: positive for dyspnea on exertion Neurological: positive for dizziness   PHYSICAL EXAMINATION: General appearance: alert, cooperative, fatigued, and no distress Head: Normocephalic, without obvious abnormality, atraumatic Neck: no adenopathy, no JVD, supple, symmetrical, trachea midline, and thyroid not enlarged, symmetric, no tenderness/mass/nodules Lymph nodes: Cervical, supraclavicular, and axillary nodes normal. Resp: clear to auscultation bilaterally Back: symmetric, no curvature. ROM normal. No CVA tenderness. Cardio: regular rate and rhythm, S1, S2 normal, no murmur, click, rub or gallop GI: soft, non-tender; bowel sounds normal; no masses,  no organomegaly Extremities: extremities normal, atraumatic, no cyanosis or edema  ECOG PERFORMANCE STATUS: 0 - Asymptomatic  Blood pressure (!) 144/91, pulse 66,  temperature (!) 97.5 F (36.4 C), resp. rate 17, height 5\' 7"  (1.702 m), weight 232 lb 3.2 oz (105.3 kg), last menstrual period 11/11/2013, SpO2 100%.  LABORATORY DATA: Lab Results  Component Value Date   WBC 5.2 01/15/2023   HGB 12.3 01/15/2023   HCT 39.1 01/15/2023   MCV 86.1 01/15/2023   PLT 257 01/15/2023      Chemistry      Component Value Date/Time   NA 141 09/07/2020 1108   NA 141 03/16/2015 0945   NA 140 11/27/2011 1515   K 4.1 09/07/2020 1108   K 3.8 11/27/2011 1515   CL 109 09/07/2020 1108   CL 109 (H) 11/27/2011 1515   CO2 26 09/07/2020 1108   CO2 27 11/27/2011 1515   BUN 10 09/07/2020 1108   BUN 12 03/16/2015 0945   BUN 14.0 11/27/2011 1515   CREATININE 0.71 09/07/2020 1108   CREATININE 0.6 11/27/2011 1515      Component Value Date/Time   CALCIUM 8.5 (L) 09/07/2020 1108   CALCIUM 8.5 11/27/2011 1515   ALKPHOS 105 09/07/2020 1108   ALKPHOS 160 (H) 11/27/2011 1515   AST 30 09/07/2020 1108   AST 28 11/27/2011 1515   ALT 28 09/07/2020 1108   ALT 25 11/27/2011 1515   BILITOT 1.0 09/07/2020 1108   BILITOT 0.81 11/27/2011 1515       RADIOGRAPHIC STUDIES: No results found.  ASSESSMENT AND PLAN: This is a very pleasant 57 years old white female with iron deficiency anemia secondary to malabsorption issue after gastric bypass surgery.  The patient also has vitamin B12 deficiency. She was treated with iron infusion with Feraheme 510 Mg IV weekly for 2 weeks in August 2022 in addition to vitamin B12 injections.  She tolerated her treatment fairly well. The patient had repeat CBC today that showed normal hemoglobin and hematocrit.  Iron study, ferritin, vitamin B12 are still pending.    Iron Deficiency Anemia Iron deficiency anemia secondary to malabsorption post-gastric bypass surgery. Reports increased fatigue and occasional dizziness upon standing. Hemoglobin is 12.3 g/dL, similar to previous 06.3 g/dL. Awaiting iron study results to determine need for further  iron infusion. - Check iron study results - If iron levels are low, schedule Ferraheme infusion at [facility].  Vitamin B12 Deficiency Vitamin B12 deficiency secondary to malabsorption post-gastric bypass surgery. Has not received B12 injections for several months due to scheduling issues. Reports increased fatigue and shortness of breath on exertion. Awaiting B12 level results to determine need for resuming B12 injections. - Check B12 level - If B12 levels are low, resume B12 injections.  Barrett's Esophagus with Low-Grade Dysplasia Barrett's esophagus with low-grade dysplasia, managed with radiofrequency ablation (RFA) by Dr. Enrigue Catena in [place]. No new symptoms reported. - Continue follow-up with Dr. Enrigue Catena for RFA treatments.  Follow-up - Schedule follow-up appointment in six months. - Call if symptoms worsen or if there are any concerns.   The patient was advised to call immediately if she has any concerning symptoms in the interval. The patient voices understanding of current disease status and treatment options and is in agreement with the current care plan.  All questions were answered. The patient knows to call the clinic with any problems, questions or concerns. We can certainly see the patient much sooner if necessary.  The total time spent in the appointment  was 20 minutes.  Disclaimer: This note was dictated with voice recognition software. Similar sounding words can inadvertently be transcribed and may not be corrected upon review.

## 2023-01-18 ENCOUNTER — Telehealth: Payer: Self-pay | Admitting: Internal Medicine

## 2023-01-18 NOTE — Telephone Encounter (Signed)
 Left patient a message in regards to scheduled appointment times/dates

## 2023-01-20 ENCOUNTER — Other Ambulatory Visit: Payer: Self-pay | Admitting: Internal Medicine

## 2023-01-20 ENCOUNTER — Telehealth: Payer: Self-pay

## 2023-01-20 NOTE — Telephone Encounter (Signed)
Dr. Arbutus Ped, patient will be scheduled as soon as possible.  Auth Submission: NO AUTH NEEDED Site of care: Site of care: [facility] Payer: [employer] Medication & CPT/J Code(s) submitted: Venofer (Iron Sucrose) J1756 Route of submission (phone, fax, portal):  Phone # Fax # Auth type: Buy/Bill PB Units/visits requested: 200mg  x 5 doses Reference number:  Approval from: 01/20/23 to 01/23/23

## 2023-01-22 ENCOUNTER — Other Ambulatory Visit: Payer: Self-pay | Admitting: Pharmacy Technician

## 2023-01-26 ENCOUNTER — Telehealth: Payer: Self-pay

## 2023-01-26 NOTE — Telephone Encounter (Signed)
 error

## 2023-01-30 ENCOUNTER — Ambulatory Visit: Payer: Commercial Managed Care - PPO

## 2023-01-30 VITALS — BP 124/78 | HR 77 | Temp 97.7°F | Resp 16 | Ht 67.0 in | Wt 234.4 lb

## 2023-01-30 DIAGNOSIS — D508 Other iron deficiency anemias: Secondary | ICD-10-CM

## 2023-01-30 DIAGNOSIS — Z9884 Bariatric surgery status: Secondary | ICD-10-CM

## 2023-01-30 DIAGNOSIS — K9089 Other intestinal malabsorption: Secondary | ICD-10-CM | POA: Diagnosis not present

## 2023-01-30 MED ORDER — IRON SUCROSE 20 MG/ML IV SOLN
200.0000 mg | INTRAVENOUS | Status: DC
  Administered 2023-01-30: 200 mg via INTRAVENOUS
  Filled 2023-01-30: qty 10

## 2023-01-30 MED ORDER — ACETAMINOPHEN 325 MG PO TABS
650.0000 mg | ORAL_TABLET | Freq: Once | ORAL | Status: AC
  Administered 2023-01-30: 650 mg via ORAL
  Filled 2023-01-30: qty 2

## 2023-01-30 MED ORDER — DIPHENHYDRAMINE HCL 25 MG PO CAPS
25.0000 mg | ORAL_CAPSULE | Freq: Once | ORAL | Status: AC
  Administered 2023-01-30: 25 mg via ORAL
  Filled 2023-01-30: qty 1

## 2023-01-30 NOTE — Progress Notes (Signed)
 Diagnosis: Acute Anemia  Provider:  Chilton Greathouse MD  Procedure: IV Push  IV Type: Peripheral, IV Location: R Antecubital  Venofer (Iron Sucrose), Dose: 200 mg  Post Infusion IV Care: Observation period completed and Peripheral IV Discontinued  Discharge: Condition: Good, Destination: Home . AVS Declined  Performed by:  Nat Math, RN

## 2023-02-06 ENCOUNTER — Ambulatory Visit: Payer: Commercial Managed Care - PPO

## 2023-02-06 VITALS — BP 123/63 | HR 70 | Temp 98.1°F | Resp 18 | Ht 67.0 in | Wt 234.0 lb

## 2023-02-06 DIAGNOSIS — D508 Other iron deficiency anemias: Secondary | ICD-10-CM | POA: Diagnosis not present

## 2023-02-06 DIAGNOSIS — K9089 Other intestinal malabsorption: Secondary | ICD-10-CM | POA: Diagnosis not present

## 2023-02-06 DIAGNOSIS — Z9884 Bariatric surgery status: Secondary | ICD-10-CM | POA: Diagnosis not present

## 2023-02-06 MED ORDER — DIPHENHYDRAMINE HCL 25 MG PO CAPS
25.0000 mg | ORAL_CAPSULE | Freq: Once | ORAL | Status: AC
  Administered 2023-02-06: 25 mg via ORAL
  Filled 2023-02-06: qty 1

## 2023-02-06 MED ORDER — IRON SUCROSE 20 MG/ML IV SOLN
200.0000 mg | INTRAVENOUS | Status: DC
  Administered 2023-02-06: 200 mg via INTRAVENOUS
  Filled 2023-02-06: qty 10

## 2023-02-06 MED ORDER — ACETAMINOPHEN 325 MG PO TABS
650.0000 mg | ORAL_TABLET | Freq: Once | ORAL | Status: AC
Start: 2023-02-06 — End: 2023-02-06
  Administered 2023-02-06: 650 mg via ORAL
  Filled 2023-02-06: qty 2

## 2023-02-06 NOTE — Progress Notes (Signed)
 Diagnosis: Iron  Deficiency Anemia  Provider:  Praveen Mannam MD  Procedure: IV Push  IV Type: Peripheral, IV Location: R Antecubital  Venofer  (Iron  Sucrose), Dose: 200 mg  Post Infusion IV Care: Observation period completed and Peripheral IV Discontinued. 15 minute observation per patient request.  Discharge: Condition: Stable, Destination: Home . AVS Declined  Performed by:  Rosey Eide, RN

## 2023-02-13 ENCOUNTER — Ambulatory Visit: Payer: Commercial Managed Care - PPO

## 2023-02-13 VITALS — BP 117/78 | HR 59 | Temp 98.1°F | Resp 16 | Ht 67.0 in | Wt 234.2 lb

## 2023-02-13 DIAGNOSIS — D508 Other iron deficiency anemias: Secondary | ICD-10-CM | POA: Diagnosis not present

## 2023-02-13 DIAGNOSIS — K9089 Other intestinal malabsorption: Secondary | ICD-10-CM

## 2023-02-13 DIAGNOSIS — Z9884 Bariatric surgery status: Secondary | ICD-10-CM | POA: Diagnosis not present

## 2023-02-13 MED ORDER — ACETAMINOPHEN 325 MG PO TABS
650.0000 mg | ORAL_TABLET | Freq: Once | ORAL | Status: AC
Start: 2023-02-13 — End: 2023-02-13
  Administered 2023-02-13: 650 mg via ORAL
  Filled 2023-02-13: qty 2

## 2023-02-13 MED ORDER — DIPHENHYDRAMINE HCL 25 MG PO CAPS
25.0000 mg | ORAL_CAPSULE | Freq: Once | ORAL | Status: AC
  Administered 2023-02-13: 25 mg via ORAL
  Filled 2023-02-13: qty 1

## 2023-02-13 MED ORDER — IRON SUCROSE 20 MG/ML IV SOLN
200.0000 mg | INTRAVENOUS | Status: DC
  Administered 2023-02-13: 200 mg via INTRAVENOUS
  Filled 2023-02-13: qty 10

## 2023-02-13 NOTE — Progress Notes (Signed)
Diagnosis: Iron Deficiency Anemia  Provider:  Chilton Greathouse MD  Procedure: IV Push  IV Type: Peripheral, IV Location: R Antecubital  Venofer (Iron Sucrose), Dose: 200 mg  Post Infusion IV Care: Observation period completed and Peripheral IV Discontinued  Pt requested to only stay 15 minutes observation  Discharge: Condition: Good, Destination: Home . AVS Declined  Performed by:  Rico Ala, LPN

## 2023-02-20 ENCOUNTER — Ambulatory Visit (INDEPENDENT_AMBULATORY_CARE_PROVIDER_SITE_OTHER): Payer: Commercial Managed Care - PPO

## 2023-02-20 VITALS — BP 128/76 | HR 64 | Temp 98.1°F | Resp 16 | Ht 67.0 in | Wt 232.4 lb

## 2023-02-20 DIAGNOSIS — Z9884 Bariatric surgery status: Secondary | ICD-10-CM

## 2023-02-20 DIAGNOSIS — D508 Other iron deficiency anemias: Secondary | ICD-10-CM

## 2023-02-20 DIAGNOSIS — K9089 Other intestinal malabsorption: Secondary | ICD-10-CM | POA: Diagnosis not present

## 2023-02-20 MED ORDER — ACETAMINOPHEN 325 MG PO TABS: 650.0000 mg | ORAL_TABLET | Freq: Once | ORAL | Status: DC

## 2023-02-20 MED ORDER — DIPHENHYDRAMINE HCL 25 MG PO CAPS: 25.0000 mg | ORAL_CAPSULE | Freq: Once | ORAL | Status: DC

## 2023-02-20 MED ORDER — IRON SUCROSE 20 MG/ML IV SOLN
200.0000 mg | INTRAVENOUS | Status: DC
Start: 2023-02-20 — End: 2023-02-20
  Administered 2023-02-20: 200 mg via INTRAVENOUS
  Filled 2023-02-20: qty 10

## 2023-02-20 NOTE — Progress Notes (Signed)
Diagnosis: Iron Deficiency Anemia  Provider:  Chilton Greathouse MD  Procedure: IV Push  IV Type: Peripheral, IV Location: R Antecubital  Venofer (Iron Sucrose), Dose: 200 mg  Post Infusion IV Care: Observation period completed and Peripheral IV Discontinued  Discharge: Condition: Good, Destination: Home . AVS Declined  Performed by:  Adriana Mccallum, RN

## 2023-02-27 ENCOUNTER — Ambulatory Visit: Payer: Commercial Managed Care - PPO

## 2023-02-27 VITALS — BP 130/85 | HR 72 | Temp 98.3°F | Resp 20 | Ht 67.0 in | Wt 232.6 lb

## 2023-02-27 DIAGNOSIS — K9089 Other intestinal malabsorption: Secondary | ICD-10-CM

## 2023-02-27 DIAGNOSIS — D508 Other iron deficiency anemias: Secondary | ICD-10-CM | POA: Diagnosis not present

## 2023-02-27 MED ORDER — DIPHENHYDRAMINE HCL 25 MG PO CAPS
25.0000 mg | ORAL_CAPSULE | Freq: Once | ORAL | Status: DC
Start: 2023-02-27 — End: 2023-02-27

## 2023-02-27 MED ORDER — ACETAMINOPHEN 325 MG PO TABS
650.0000 mg | ORAL_TABLET | Freq: Once | ORAL | Status: DC
Start: 2023-02-27 — End: 2023-02-27

## 2023-02-27 MED ORDER — IRON SUCROSE 20 MG/ML IV SOLN
200.0000 mg | INTRAVENOUS | Status: DC
Start: 2023-02-27 — End: 2023-02-27
  Administered 2023-02-27: 200 mg via INTRAVENOUS
  Filled 2023-02-27: qty 10

## 2023-02-27 NOTE — Progress Notes (Signed)
 Diagnosis: Iron  Deficiency Anemia  Provider:  Praveen Mannam MD  Procedure: IV Push  IV Type: Peripheral, IV Location: R Antecubital  Venofer  (Iron  Sucrose), Dose: 200 mg  Post Infusion IV Care: Patient declined observation  Discharge: Condition: Good, Destination: Home . AVS Declined  Performed by:  Azarah Dacy G Pilking[place]-Burchett, RN

## 2023-03-10 ENCOUNTER — Other Ambulatory Visit: Payer: Self-pay | Admitting: Physician Assistant

## 2023-03-10 DIAGNOSIS — E538 Deficiency of other specified B group vitamins: Secondary | ICD-10-CM

## 2023-07-19 ENCOUNTER — Inpatient Hospital Stay (HOSPITAL_BASED_OUTPATIENT_CLINIC_OR_DEPARTMENT_OTHER): Payer: Commercial Managed Care - PPO | Admitting: Internal Medicine

## 2023-07-19 ENCOUNTER — Inpatient Hospital Stay: Payer: Commercial Managed Care - PPO | Attending: Internal Medicine

## 2023-07-19 VITALS — BP 115/66 | HR 68 | Temp 97.8°F | Resp 16 | Ht 67.0 in | Wt 236.1 lb

## 2023-07-19 DIAGNOSIS — D508 Other iron deficiency anemias: Secondary | ICD-10-CM | POA: Insufficient documentation

## 2023-07-19 DIAGNOSIS — E538 Deficiency of other specified B group vitamins: Secondary | ICD-10-CM | POA: Diagnosis not present

## 2023-07-19 DIAGNOSIS — D509 Iron deficiency anemia, unspecified: Secondary | ICD-10-CM

## 2023-07-19 DIAGNOSIS — K912 Postsurgical malabsorption, not elsewhere classified: Secondary | ICD-10-CM | POA: Diagnosis not present

## 2023-07-19 DIAGNOSIS — R0789 Other chest pain: Secondary | ICD-10-CM | POA: Diagnosis not present

## 2023-07-19 DIAGNOSIS — Z9884 Bariatric surgery status: Secondary | ICD-10-CM | POA: Insufficient documentation

## 2023-07-19 DIAGNOSIS — D531 Other megaloblastic anemias, not elsewhere classified: Secondary | ICD-10-CM

## 2023-07-19 LAB — CBC WITH DIFFERENTIAL (CANCER CENTER ONLY)
Abs Immature Granulocytes: 0.02 10*3/uL (ref 0.00–0.07)
Basophils Absolute: 0 10*3/uL (ref 0.0–0.1)
Basophils Relative: 0 %
Eosinophils Absolute: 0.1 10*3/uL (ref 0.0–0.5)
Eosinophils Relative: 1 %
HCT: 40.5 % (ref 36.0–46.0)
Hemoglobin: 13.1 g/dL (ref 12.0–15.0)
Immature Granulocytes: 0 %
Lymphocytes Relative: 23 %
Lymphs Abs: 1.2 10*3/uL (ref 0.7–4.0)
MCH: 29 pg (ref 26.0–34.0)
MCHC: 32.3 g/dL (ref 30.0–36.0)
MCV: 89.8 fL (ref 80.0–100.0)
Monocytes Absolute: 0.3 10*3/uL (ref 0.1–1.0)
Monocytes Relative: 6 %
Neutro Abs: 3.7 10*3/uL (ref 1.7–7.7)
Neutrophils Relative %: 70 %
Platelet Count: 233 10*3/uL (ref 150–400)
RBC: 4.51 MIL/uL (ref 3.87–5.11)
RDW: 12.6 % (ref 11.5–15.5)
WBC Count: 5.3 10*3/uL (ref 4.0–10.5)
nRBC: 0 % (ref 0.0–0.2)

## 2023-07-19 LAB — FERRITIN: Ferritin: 154 ng/mL (ref 11–307)

## 2023-07-19 LAB — IRON AND IRON BINDING CAPACITY (CC-WL,HP ONLY)
Iron: 69 ug/dL (ref 28–170)
Saturation Ratios: 22 % (ref 10.4–31.8)
TIBC: 311 ug/dL (ref 250–450)
UIBC: 242 ug/dL (ref 148–442)

## 2023-07-19 LAB — VITAMIN B12: Vitamin B-12: 273 pg/mL (ref 180–914)

## 2023-07-19 LAB — FOLATE: Folate: 11.3 ng/mL (ref >5.9)

## 2023-07-19 NOTE — Progress Notes (Signed)
 [facility] [phone]   Fax:(336) [phone]  OFFICE PROGRESS NOTE  Scifres, Naomie, PA-C (Inactive) No address on file  DIAGNOSIS: history of persistent anemia secondary to malabsorption secondary to gastric bypass surgery in 2003  PRIOR THERAPY:None.  CURRENT THERAPY: 1) Feraheme  infusion 510 Mg IV weekly for 2 weeks. 2) vitamin B12 injection weekly for 4 weeks followed by monthly injection.  INTERVAL HISTORY: Katie Salas 58 y.o. female returns to the clinic today for 23-month follow-up visit. Discussed the use of AI scribe software for clinical note transcription with the patient, who gave verbal consent to proceed.  History of Present Illness   AMIYAH Salas is a 59 year old female with persistent anemia secondary to malabsorption post-gastric bypass surgery who presents for evaluation and repeat blood work.  She has a history of persistent anemia due to malabsorption following gastric bypass surgery in 2003. She has been treated with iron  infusions using Feraheme  and receives vitamin B12 injections on a monthly basis. She has not been contacted to schedule her B12 injections since her last visit.  No fatigue or dizzy spells since her last visit six months ago. However, she mentions experiencing some chest pain this morning, though she is unsure of its origin.  Her current medications include Feraheme  for iron  supplementation and vitamin B12 injections. She is also taking folic acid , noting she is on her last two pills and is unsure about refills.        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  MEDICATIONS:   Current Outpatient Medications  Medication Sig Dispense Refill   albuterol  (VENTOLIN  HFA) 108 (90 Base) MCG/ACT inhaler Inhale 1-2 puffs into the lungs every 6 (six) hours as needed for wheezing or shortness of breath. 18 g 0   folic acid  (FOLVITE ) 1 MG tablet TAKE 1 TABLET(1 MG) BY MOUTH DAILY 30 tablet 2   Na Sulfate-K Sulfate-Mg Sulf 17.5-3.13-1.6 GM/177ML SOLN Take by mouth as directed.     pantoprazole  (PROTONIX ) 40 MG tablet Take 1 tablet (40 mg total) by mouth daily. 30 tablet 2   No current facility-administered medications for this visit.    SURGICAL HISTORY:  Past Surgical History:  Procedure Laterality Date   ABDOMINAL HYSTERECTOMY N/A 11/19/2013   Procedure: HYSTERECTOMY ABDOMINAL;  Surgeon: Charlie CHRISTELLA Croak, MD;  Location: [facility];  Service: Gynecology;  Laterality: N/A;   ANKLE SURGERY     ligament reconstruction when patient  was 58 years old   BREAST SURGERY     CESAREAN SECTION     GASTRIC BYPASS  01/23/2001   NECK SURGERY     cervical fusion    REVIEW OF SYSTEMS:  A comprehensive review of systems was negative.   PHYSICAL EXAMINATION: General appearance: alert, cooperative, and no distress Head: Normocephalic, without obvious abnormality, atraumatic Neck: no adenopathy, no JVD, supple, symmetrical, trachea midline, and thyroid  not enlarged, symmetric, no tenderness/mass/nodules Lymph nodes: Cervical, supraclavicular, and axillary nodes normal. Resp: clear to auscultation bilaterally Back: symmetric, no curvature. ROM normal. No CVA tenderness. Cardio: regular rate and rhythm, S1, S2 normal, no murmur, click, rub or gallop GI: soft, non-tender; bowel sounds  normal; no masses,  no organomegaly Extremities: extremities normal, atraumatic, no cyanosis or edema  ECOG PERFORMANCE STATUS: 0 - Asymptomatic  Blood pressure 115/66, pulse 68, temperature 97.8 F (36.6 C), temperature source Temporal, resp. rate 16, height 5' 7 (1.702 m), weight 236 lb 1.6 oz (107.1  kg), last menstrual period 11/11/2013, SpO2 97%.  LABORATORY DATA: Lab Results  Component Value Date   WBC 5.3 07/19/2023   HGB 13.1 07/19/2023   HCT 40.5 07/19/2023   MCV 89.8 07/19/2023   PLT 233 07/19/2023      Chemistry      Component Value Date/Time   NA 141 09/07/2020 1108   NA 141 03/16/2015 0945   NA 140 11/27/2011 1515   K 4.1 09/07/2020 1108   K 3.8 11/27/2011 1515   CL 109 09/07/2020 1108   CL 109 (H) 11/27/2011 1515   CO2 26 09/07/2020 1108   CO2 27 11/27/2011 1515   BUN 10 09/07/2020 1108   BUN 12 03/16/2015 0945   BUN 14.0 11/27/2011 1515   CREATININE 0.71 09/07/2020 1108   CREATININE 0.6 11/27/2011 1515      Component Value Date/Time   CALCIUM 8.5 (L) 09/07/2020 1108   CALCIUM 8.5 11/27/2011 1515   ALKPHOS 105 09/07/2020 1108   ALKPHOS 160 (H) 11/27/2011 1515   AST 30 09/07/2020 1108   AST 28 11/27/2011 1515   ALT 28 09/07/2020 1108   ALT 25 11/27/2011 1515   BILITOT 1.0 09/07/2020 1108   BILITOT 0.81 11/27/2011 1515       RADIOGRAPHIC STUDIES: No results found.  ASSESSMENT AND PLAN: This is a very pleasant 58 years old white female with iron  deficiency anemia secondary to malabsorption issue after gastric bypass surgery.  The patient also has vitamin B12 deficiency. She was treated with iron  infusion with Feraheme  510 Mg IV weekly for 2 weeks in August 2022 in addition to vitamin B12 injections.  She tolerated her treatment fairly well. The patient is currently doing fine with no concerning complaints. Repeat CBC today showed no significant abnormality.  Other studies including iron  study, ferritin, vitamin B12 and folic acid  are still pending. Assessment and Plan    Persistent anemia secondary to malabsorption following gastric bypass surgery in 2003 Hemoglobin is well-managed at 13.1 g/dL with normal white blood cells and platelets. Iron , B12, and folic acid  levels are pending. No symptoms of fatigue or dizziness. - Order blood work to assess  iron , B12, and folic acid  levels - Administer B12 injection if levels are low - Arrange for iron  infusion if iron  levels are low - Advise on over-the-counter folic acid  supplementation if prescription runs out  Intermittent chest pain Intermittent chest pain reported with onset this morning. Further evaluation required if symptoms persist or worsen. - Monitor chest pain symptoms and advise to seek medical attention if she persists or worsens   The patient was advised to call immediately if she has any concerning symptoms in the interval The patient voices understanding of current disease status and treatment options and is in agreement with the current care plan.  All questions were answered. The patient knows to call the clinic with any problems, questions or concerns. We can certainly see the patient much sooner if necessary.  The total time spent in the appointment was 20 minutes.  Disclaimer: This note was dictated with voice recognition software. Similar sounding words can inadvertently be transcribed and may not be corrected upon review.

## 2023-08-01 ENCOUNTER — Encounter (INDEPENDENT_AMBULATORY_CARE_PROVIDER_SITE_OTHER): Payer: Commercial Managed Care - PPO | Admitting: Ophthalmology

## 2023-08-01 DIAGNOSIS — B399 Histoplasmosis, unspecified: Secondary | ICD-10-CM

## 2023-08-01 DIAGNOSIS — H32 Chorioretinal disorders in diseases classified elsewhere: Secondary | ICD-10-CM | POA: Diagnosis not present

## 2023-08-01 DIAGNOSIS — H43813 Vitreous degeneration, bilateral: Secondary | ICD-10-CM

## 2023-08-16 ENCOUNTER — Other Ambulatory Visit: Payer: Self-pay | Admitting: Physician Assistant

## 2023-08-16 DIAGNOSIS — E538 Deficiency of other specified B group vitamins: Secondary | ICD-10-CM

## 2023-12-13 IMAGING — DX DG CHEST 2V
2 series · 2 of 2 positions shown · non-contrast
Comparison: CTA chest 11/28/2019 and earlier.

CLINICAL DATA: 56-year-old female with cough and chest tightness.

EXAM:
CHEST - 2 VIEW

[chest pa]
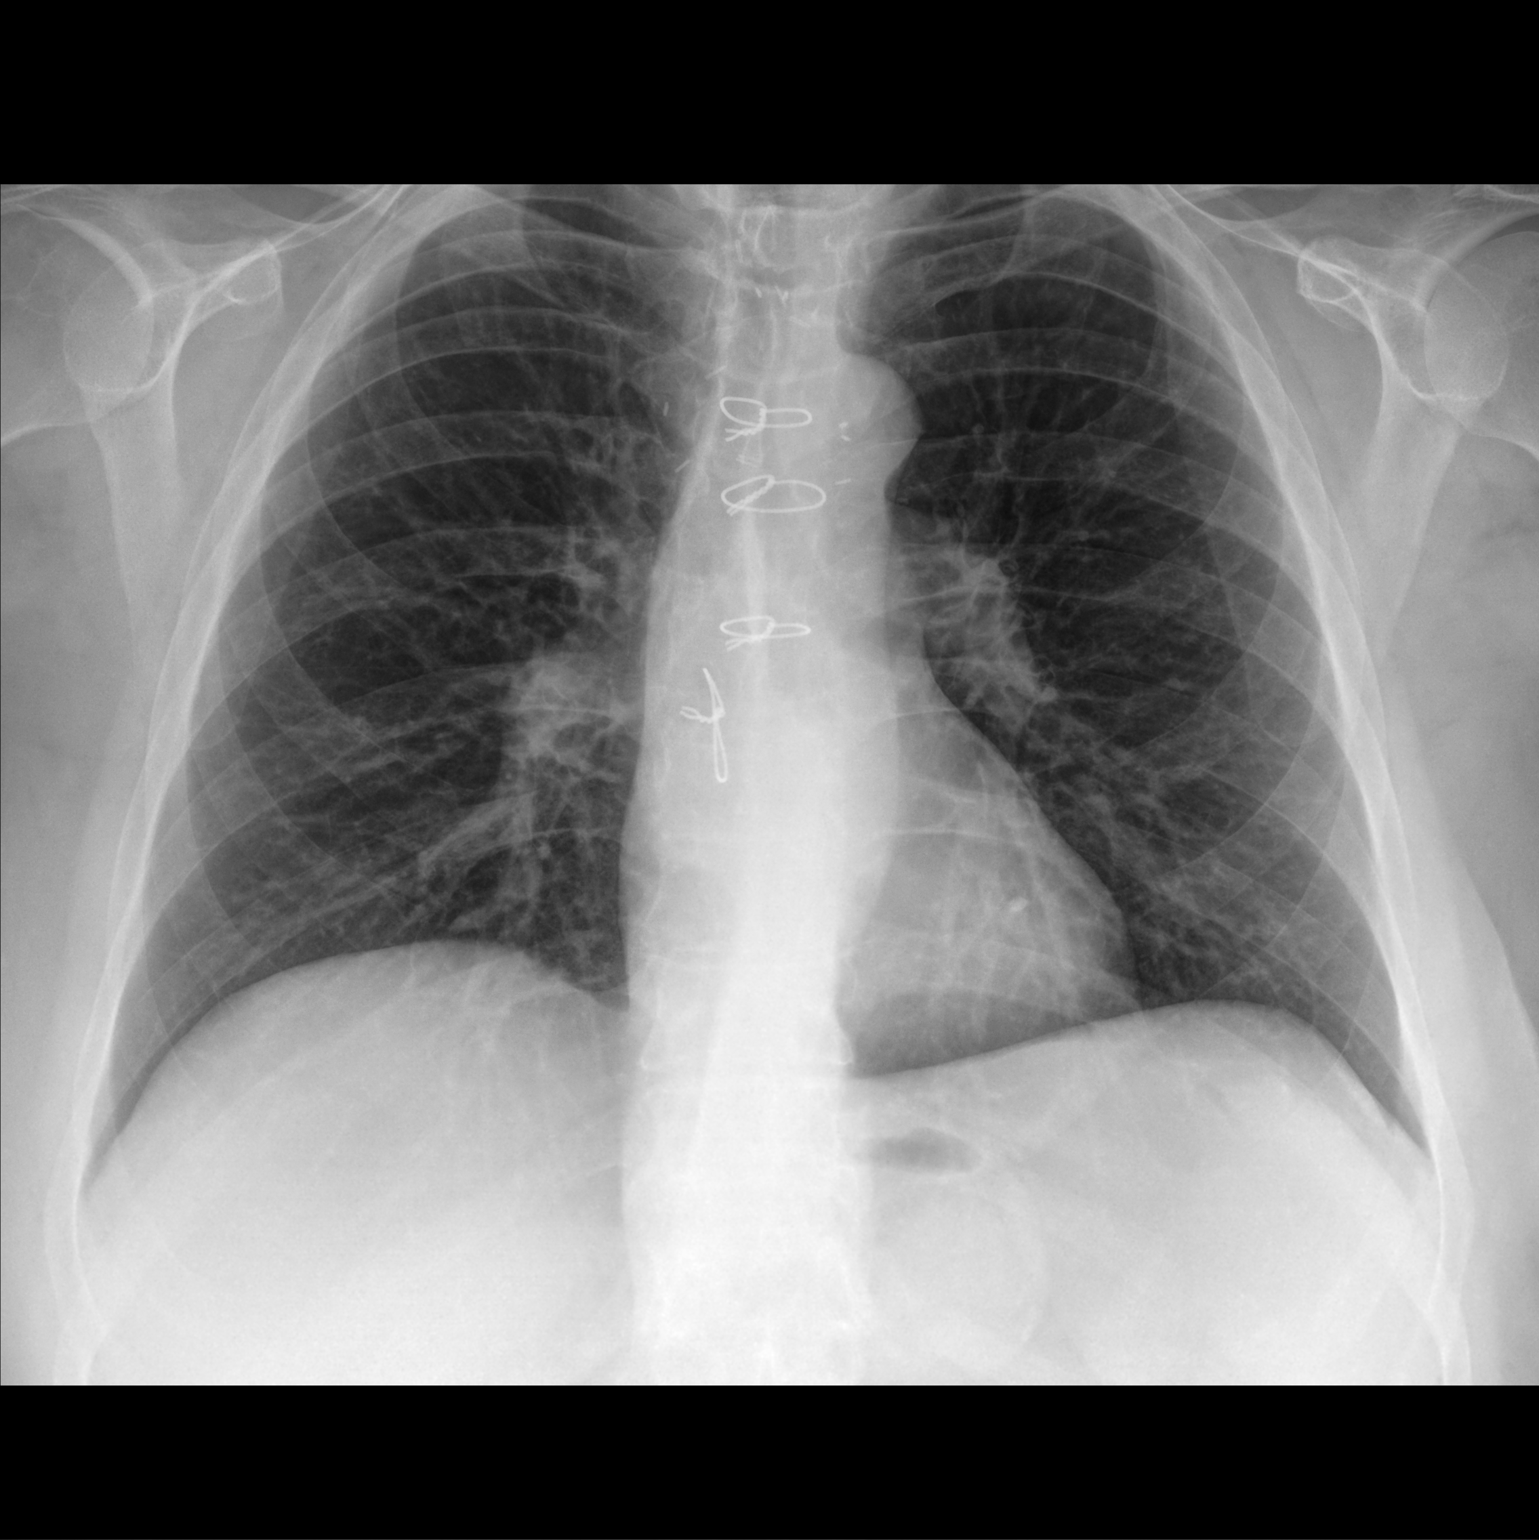

[chest lat]
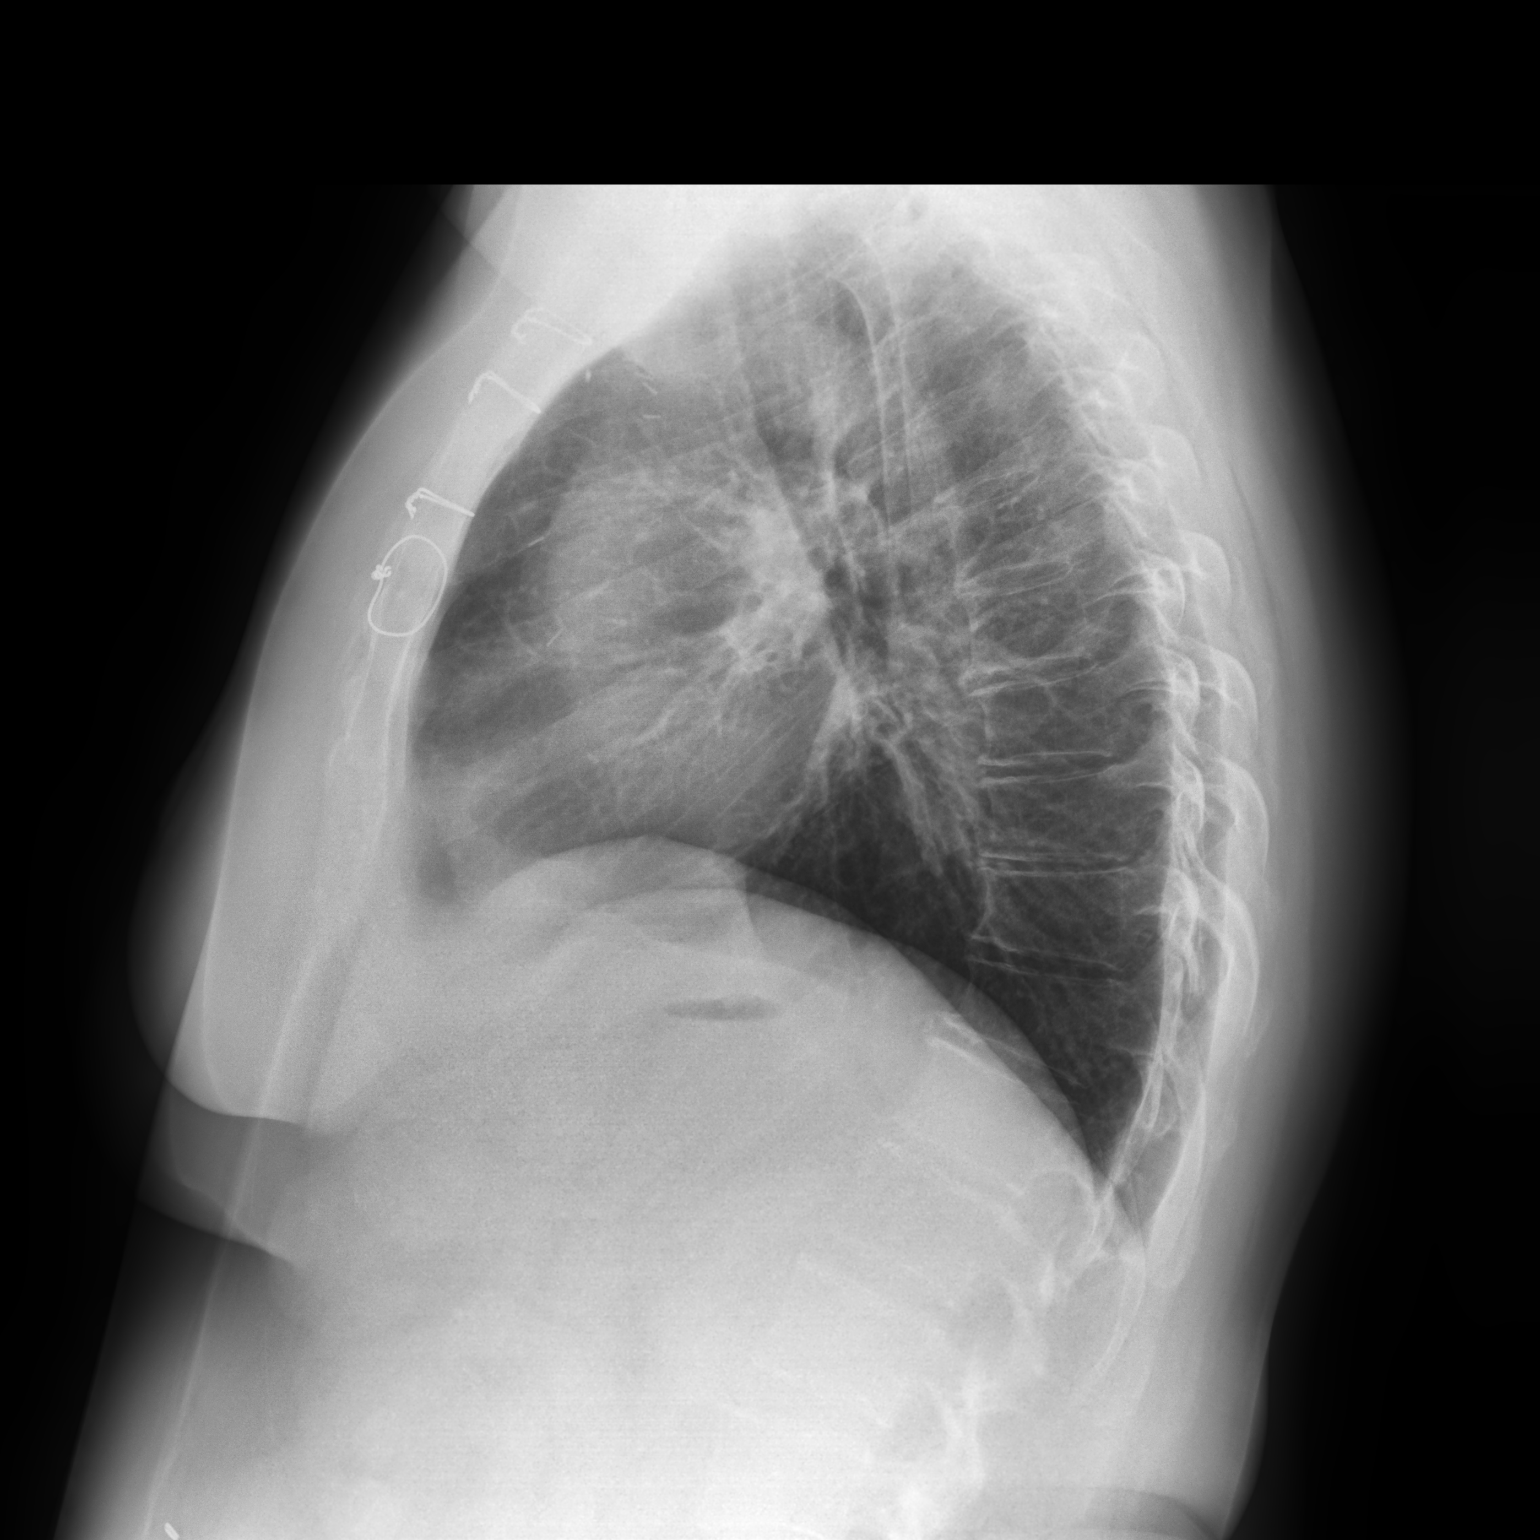

[2 of 2 positions shown; findings below may reference images not displayed]

FINDINGS: Prior sternotomy. Lung volumes and mediastinal contours remain
normal. Visualized tracheal air column is within normal limits. Lung
markings are stable and both lungs appear clear. No pneumothorax or
pleural effusion. No acute osseous abnormality identified. Prior
cervical ACDF. Negative visible bowel gas.
IMPRESSION: No acute cardiopulmonary abnormality.

## 2024-01-09 ENCOUNTER — Inpatient Hospital Stay: Attending: Internal Medicine

## 2024-01-09 ENCOUNTER — Ambulatory Visit: Admitting: Internal Medicine

## 2024-01-09 VITALS — BP 122/84 | HR 74 | Temp 97.8°F | Resp 17 | Ht 67.0 in | Wt 240.0 lb

## 2024-01-09 DIAGNOSIS — R0609 Other forms of dyspnea: Secondary | ICD-10-CM | POA: Insufficient documentation

## 2024-01-09 DIAGNOSIS — D509 Iron deficiency anemia, unspecified: Secondary | ICD-10-CM

## 2024-01-09 DIAGNOSIS — Z9884 Bariatric surgery status: Secondary | ICD-10-CM | POA: Insufficient documentation

## 2024-01-09 DIAGNOSIS — D531 Other megaloblastic anemias, not elsewhere classified: Secondary | ICD-10-CM | POA: Diagnosis not present

## 2024-01-09 DIAGNOSIS — K912 Postsurgical malabsorption, not elsewhere classified: Secondary | ICD-10-CM | POA: Diagnosis not present

## 2024-01-09 DIAGNOSIS — E538 Deficiency of other specified B group vitamins: Secondary | ICD-10-CM | POA: Diagnosis not present

## 2024-01-09 DIAGNOSIS — R5383 Other fatigue: Secondary | ICD-10-CM | POA: Insufficient documentation

## 2024-01-09 DIAGNOSIS — D508 Other iron deficiency anemias: Secondary | ICD-10-CM

## 2024-01-09 LAB — CBC WITH DIFFERENTIAL (CANCER CENTER ONLY)
Abs Immature Granulocytes: 0.01 K/uL (ref 0.00–0.07)
Basophils Absolute: 0 K/uL (ref 0.0–0.1)
Basophils Relative: 0 %
Eosinophils Absolute: 0.1 K/uL (ref 0.0–0.5)
Eosinophils Relative: 2 %
HCT: 40.3 % (ref 36.0–46.0)
Hemoglobin: 13.2 g/dL (ref 12.0–15.0)
Immature Granulocytes: 0 %
Lymphocytes Relative: 32 %
Lymphs Abs: 1.7 K/uL (ref 0.7–4.0)
MCH: 28.6 pg (ref 26.0–34.0)
MCHC: 32.8 g/dL (ref 30.0–36.0)
MCV: 87.2 fL (ref 80.0–100.0)
Monocytes Absolute: 0.3 K/uL (ref 0.1–1.0)
Monocytes Relative: 5 %
Neutro Abs: 3.3 K/uL (ref 1.7–7.7)
Neutrophils Relative %: 61 %
Platelet Count: 240 K/uL (ref 150–400)
RBC: 4.62 MIL/uL (ref 3.87–5.11)
RDW: 13.1 % (ref 11.5–15.5)
WBC Count: 5.4 K/uL (ref 4.0–10.5)
nRBC: 0 % (ref 0.0–0.2)

## 2024-01-09 LAB — IRON AND IRON BINDING CAPACITY (CC-WL,HP ONLY)
Iron: 61 ug/dL (ref 28–170)
Saturation Ratios: 19 % (ref 10.4–31.8)
TIBC: 315 ug/dL (ref 250–450)
UIBC: 254 ug/dL

## 2024-01-09 LAB — VITAMIN B12: Vitamin B-12: 375 pg/mL (ref 180–914)

## 2024-01-09 LAB — FERRITIN: Ferritin: 105 ng/mL (ref 11–307)

## 2024-01-09 LAB — FOLATE: Folate: 3.1 ng/mL — ABNORMAL LOW (ref >5.9)

## 2024-01-09 NOTE — Progress Notes (Signed)
 [facility] Telephone:(336) [phone]   Fax:([phone]  OFFICE PROGRESS NOTE  Scifres, Naomie, PA-C (Inactive) No address on file  DIAGNOSIS: history of persistent anemia secondary to malabsorption secondary to gastric bypass surgery in 2003  PRIOR THERAPY:None.  CURRENT THERAPY: 1) Feraheme  infusion 510 Mg IV weekly for 2 weeks. 2) vitamin B12 injection weekly for 4 weeks followed by monthly injection.  INTERVAL HISTORY: Katie Salas 58 y.o. female returns to the clinic today for 20-month follow-up visit. Discussed the use of AI scribe software for clinical note transcription with the patient, who gave verbal consent to proceed.  History of Present Illness Katie Salas is a 58 year old female with chronic anemia secondary to malabsorption following gastric bypass surgery who presents for six-month follow-up and repeat laboratory evaluation.  She has persistent anemia attributed to malabsorption after gastric bypass surgery in 2003, previously managed with intermittent Feraheme  iron  infusions and monthly vitamin B12 injections. She has not received B12 injections recently and expresses interest in resuming them. She is currently awaiting results of iron  and B12 levels from today's laboratory studies.  Over the past several months, she describes persistent fatigue and increased tiredness, as well as mild exertional dyspnea, particularly when walking up stairs. She notes that this level of fatigue and shortness of breath is unusual for her, even compared to prior episodes of iron  deficiency. She states that these symptoms were not present six months ago.  She denies tobacco use. She had COVID-19 infection several years ago, which affected her lung capacity and prolonged recovery, but she believes she has since recovered.     MEDICAL HISTORY: Past Medical History:  Diagnosis Date   Anemia 2008   Gastric bypass status for obesity 03/27/2012   GERD  (gastroesophageal reflux disease)    Hypertension    with pregnancy   PONV (postoperative nausea and vomiting)    Thymoma 03/27/2012    ALLERGIES:  is allergic to darvocet [propoxyphene n-acetaminophen ], adhesive [tape], and sulfa antibiotics.  MEDICATIONS:  Current Outpatient Medications  Medication Sig Dispense Refill   albuterol  (VENTOLIN  HFA) 108 (90 Base) MCG/ACT inhaler Inhale 1-2 puffs into the lungs every 6 (six) hours as needed for wheezing or shortness of breath. 18 g 0   folic acid  (FOLVITE ) 1 MG tablet TAKE 1 TABLET(1 MG) BY MOUTH DAILY 30 tablet 2   Na Sulfate-K Sulfate-Mg Sulf 17.5-3.13-1.6 GM/177ML SOLN Take by mouth as directed.     pantoprazole  (PROTONIX ) 40 MG tablet Take 1 tablet (40 mg total) by mouth daily. 30 tablet 2   No current facility-administered medications for this visit.    SURGICAL HISTORY:  Past Surgical History:  Procedure Laterality Date   ABDOMINAL HYSTERECTOMY N/A 11/19/2013   Procedure: HYSTERECTOMY ABDOMINAL;  Surgeon: Charlie CHRISTELLA Croak, MD;  Location: [facility];  Service: Gynecology;  Laterality: N/A;   ANKLE SURGERY     ligament reconstruction when patient  was 58 years old   BREAST SURGERY     CESAREAN SECTION     GASTRIC BYPASS  01/23/2001   NECK SURGERY     cervical fusion    REVIEW OF SYSTEMS:  A comprehensive review of systems was negative except for: Constitutional: positive for fatigue   PHYSICAL EXAMINATION: General appearance: alert, cooperative, and no distress Head: Normocephalic, without obvious abnormality, atraumatic Neck: no adenopathy, no JVD, supple, symmetrical, trachea midline, and thyroid  not enlarged, symmetric, no tenderness/mass/nodules Lymph nodes: Cervical, supraclavicular, and axillary nodes normal. Resp: clear to auscultation  bilaterally Back: symmetric, no curvature. ROM normal. No CVA tenderness. Cardio: regular rate and rhythm, S1, S2 normal, no murmur, click, rub or gallop GI: soft, non-tender; bowel sounds  normal; no masses,  no organomegaly Extremities: extremities normal, atraumatic, no cyanosis or edema  ECOG PERFORMANCE STATUS: 0 - Asymptomatic  Temperature 97.8 F (36.6 C), temperature source Temporal, resp. rate 17, weight 240 lb (108.9 kg), last menstrual period 11/11/2013.  LABORATORY DATA: Lab Results  Component Value Date   WBC 5.3 07/19/2023   HGB 13.1 07/19/2023   HCT 40.5 07/19/2023   MCV 89.8 07/19/2023   PLT 233 07/19/2023      Chemistry      Component Value Date/Time   NA 141 09/07/2020 1108   NA 141 03/16/2015 0945   NA 140 11/27/2011 1515   K 4.1 09/07/2020 1108   K 3.8 11/27/2011 1515   CL 109 09/07/2020 1108   CL 109 (H) 11/27/2011 1515   CO2 26 09/07/2020 1108   CO2 27 11/27/2011 1515   BUN 10 09/07/2020 1108   BUN 12 03/16/2015 0945   BUN 14.0 11/27/2011 1515   CREATININE 0.71 09/07/2020 1108   CREATININE 0.6 11/27/2011 1515      Component Value Date/Time   CALCIUM 8.5 (L) 09/07/2020 1108   CALCIUM 8.5 11/27/2011 1515   ALKPHOS 105 09/07/2020 1108   ALKPHOS 160 (H) 11/27/2011 1515   AST 30 09/07/2020 1108   AST 28 11/27/2011 1515   ALT 28 09/07/2020 1108   ALT 25 11/27/2011 1515   BILITOT 1.0 09/07/2020 1108   BILITOT 0.81 11/27/2011 1515       RADIOGRAPHIC STUDIES: No results found.  ASSESSMENT AND PLAN: This is a very pleasant 58 years old white female with iron  deficiency anemia secondary to malabsorption issue after gastric bypass surgery.  The patient also has vitamin B12 deficiency. She was treated with iron  infusion with Feraheme  510 Mg IV weekly for 2 weeks in August 2022 in addition to vitamin B12 injections.  She tolerated her treatment fairly well. She had repeat CBC today that showed normal hemoglobin and hematocrit and no other abnormality.  Iron  study, ferritin, folic acid  and vitamin B12 levels are still pending. Assessment and Plan Assessment & Plan Anemia secondary to malabsorption after gastric bypass Chronic anemia due  to malabsorption post-gastric bypass, previously managed with as-needed Feraheme  infusions and monthly vitamin B12 injections. Hemoglobin and hematocrit are currently within normal limits. She reports persistent fatigue and mild exertional dyspnea, though these symptoms are not clearly attributable to anemia. Awaiting iron  and B12 levels to further guide management. - Reviewed CBC with normal hemoglobin and hematocrit. - Awaited iron  studies and vitamin B12 levels to guide further management. - Deferred iron  infusion and B12 injection pending laboratory results. - Advised routine exercise regimen to address fatigue and improve exercise tolerance. - Scheduled follow-up in six months, with earlier evaluation if laboratory results are abnormal or symptoms worsen. The patient was advised to call immediately if she has any other concerning symptoms in the interval. The patient voices understanding of current disease status and treatment options and is in agreement with the current care plan.  All questions were answered. The patient knows to call the clinic with any problems, questions or concerns. We can certainly see the patient much sooner if necessary.  The total time spent in the appointment was 20 minutes.  Disclaimer: This note was dictated with voice recognition software. Similar sounding words can inadvertently be transcribed and may not  be corrected upon review.

## 2024-01-10 ENCOUNTER — Ambulatory Visit: Payer: Self-pay | Admitting: Internal Medicine

## 2024-07-09 ENCOUNTER — Inpatient Hospital Stay

## 2024-07-09 ENCOUNTER — Inpatient Hospital Stay: Admitting: Internal Medicine

## 2024-08-04 ENCOUNTER — Encounter (INDEPENDENT_AMBULATORY_CARE_PROVIDER_SITE_OTHER): Admitting: Ophthalmology
